# Patient Record
Sex: Female | Born: 1990 | Race: White | Hispanic: No | Marital: Married | Smoking: Never smoker
Health system: Southern US, Community
[De-identification: ages and names within clinical notes are randomized; demographics above are authoritative.]

## PROBLEM LIST (undated history)

## (undated) DIAGNOSIS — K219 Gastro-esophageal reflux disease without esophagitis: Secondary | ICD-10-CM

## (undated) DIAGNOSIS — N200 Calculus of kidney: Secondary | ICD-10-CM

## (undated) DIAGNOSIS — F419 Anxiety disorder, unspecified: Secondary | ICD-10-CM

## (undated) DIAGNOSIS — K8020 Calculus of gallbladder without cholecystitis without obstruction: Secondary | ICD-10-CM

## (undated) DIAGNOSIS — R519 Headache, unspecified: Secondary | ICD-10-CM

## (undated) DIAGNOSIS — S060XAA Concussion with loss of consciousness status unknown, initial encounter: Secondary | ICD-10-CM

## (undated) HISTORY — DX: Gastro-esophageal reflux disease without esophagitis: K21.9

---

## 2013-07-27 HISTORY — PX: DILATION AND CURETTAGE OF UTERUS: SHX78

## 2015-10-29 DIAGNOSIS — Z6823 Body mass index (BMI) 23.0-23.9, adult: Secondary | ICD-10-CM | POA: Diagnosis not present

## 2015-10-29 DIAGNOSIS — N939 Abnormal uterine and vaginal bleeding, unspecified: Secondary | ICD-10-CM | POA: Diagnosis not present

## 2015-10-29 DIAGNOSIS — R102 Pelvic and perineal pain: Secondary | ICD-10-CM | POA: Diagnosis not present

## 2015-11-14 DIAGNOSIS — R102 Pelvic and perineal pain: Secondary | ICD-10-CM | POA: Diagnosis not present

## 2015-11-15 DIAGNOSIS — H60399 Other infective otitis externa, unspecified ear: Secondary | ICD-10-CM | POA: Diagnosis not present

## 2016-01-06 DIAGNOSIS — H938X3 Other specified disorders of ear, bilateral: Secondary | ICD-10-CM | POA: Diagnosis not present

## 2016-01-06 DIAGNOSIS — H6983 Other specified disorders of Eustachian tube, bilateral: Secondary | ICD-10-CM | POA: Diagnosis not present

## 2016-01-08 DIAGNOSIS — W57XXXA Bitten or stung by nonvenomous insect and other nonvenomous arthropods, initial encounter: Secondary | ICD-10-CM | POA: Diagnosis not present

## 2016-01-08 DIAGNOSIS — S0006XA Insect bite (nonvenomous) of scalp, initial encounter: Secondary | ICD-10-CM | POA: Diagnosis not present

## 2016-01-14 ENCOUNTER — Ambulatory Visit (INDEPENDENT_AMBULATORY_CARE_PROVIDER_SITE_OTHER): Payer: BLUE CROSS/BLUE SHIELD | Admitting: Osteopathic Medicine

## 2016-01-14 ENCOUNTER — Encounter: Payer: Self-pay | Admitting: Osteopathic Medicine

## 2016-01-14 VITALS — BP 118/74 | HR 84 | Ht 65.0 in | Wt 136.0 lb

## 2016-01-14 DIAGNOSIS — Z0000 Encounter for general adult medical examination without abnormal findings: Secondary | ICD-10-CM | POA: Diagnosis not present

## 2016-01-14 DIAGNOSIS — Z862 Personal history of diseases of the blood and blood-forming organs and certain disorders involving the immune mechanism: Secondary | ICD-10-CM

## 2016-01-14 DIAGNOSIS — IMO0001 Reserved for inherently not codable concepts without codable children: Secondary | ICD-10-CM | POA: Insufficient documentation

## 2016-01-14 DIAGNOSIS — R5382 Chronic fatigue, unspecified: Secondary | ICD-10-CM

## 2016-01-14 DIAGNOSIS — Z309 Encounter for contraceptive management, unspecified: Secondary | ICD-10-CM

## 2016-01-14 DIAGNOSIS — Z8249 Family history of ischemic heart disease and other diseases of the circulatory system: Secondary | ICD-10-CM | POA: Diagnosis not present

## 2016-01-14 NOTE — Patient Instructions (Signed)
Can come back for fasting labs If labs are looking ok, can come back in one year for annual physical / wellness exam, but sooner if needed!

## 2016-01-14 NOTE — Progress Notes (Signed)
HPI: Cindy Dunlap is a 25 y.o. female who presents to [name] today for chief complaint of:  Chief Complaint  Patient presents with  . Establish Care    DISCUSS IRON AND CHOLESTEROL    IRON - concerned about low iron, has been longstanding issues. (+) anemia in pregnancy.   CHOLESTEROL - concerned about cholesterol, father deceased of MI in 56s.   PREVENTIVE CARE REVIEWED AS BELOW  Past medical, social and family history reviewed: History reviewed. No pertinent past medical history. History reviewed. No pertinent past surgical history. Social History  Substance Use Topics  . Smoking status: Not on file  . Smokeless tobacco: Not on file  . Alcohol Use: Not on file   Family History  Problem Relation Age of Onset  . Hyperlipidemia Father   . Hypertension Father   . Diabetes Paternal Grandmother   . Stroke Paternal Grandmother   . Cancer Paternal Grandfather     LIVER  . Heart attack Father   . Heart attack Paternal Grandfather   . Heart attack Paternal Grandmother     No current outpatient prescriptions on file.   No current facility-administered medications for this visit.   Allergies  Allergen Reactions  . Ciprofloxacin Other (See Comments)    Headache      Review of Systems: CONSTITUTIONAL:  No  fever, no chills, No recent illness, No unintentional weight changes HEAD/EYES/EARS/NOSE/THROAT: No  headache, no vision change, no hearing change, No sore throat, No  sinus pressure CARDIAC: No  chest pain, No  pressure, No palpitations, No  orthopnea RESPIRATORY: No  cough, No  shortness of breath/wheeze GASTROINTESTINAL: No  nausea, No  vomiting, No  abdominal pain, No  blood in stool, No  diarrhea, No  constipation  MUSCULOSKELETAL: No  myalgia/arthralgia GENITOURINARY: No  incontinence, No  abnormal genital bleeding/discharge SKIN: No  rash/wounds/concerning lesions HEM/ONC: No  easy bruising/bleeding, No  abnormal lymph  node ENDOCRINE: No polyuria/polydipsia/polyphagia, No  heat/cold intolerance  NEUROLOGIC: No  weakness, No  dizziness, No  slurred speech PSYCHIATRIC: No  concerns with depression, No  concerns with anxiety, No sleep problems  Exam:  BP 118/74 mmHg  Pulse 84  Ht  (1.651 m)  Wt 136 lb (61.689 kg)  BMI 22.63 kg/m2 Constitutional: VS see above. General Appearance: alert, well-developed, well-nourished, NAD Eyes: Normal lids and conjunctive, non-icteric sclera, PERRLA Ears, Nose, Mouth, Throat: MMM, Normal external inspection ears/nares/mouth/lips/gums, TM normal bilaterally. Pharynx/tonsils no erythema, no exudate. Nasal mucosa normal.  Neck: No masses, trachea midline. No thyroid enlargement. No tenderness/mass appreciated. No lymphadenopathy Respiratory: Normal respiratory effort. no wheeze, no rhonchi, no rales Cardiovascular: S1/S2 normal, no murmur, no rub/gallop auscultated. RRR. No lower extremity edema. Gastrointestinal: Nontender, no masses. No hepatomegaly, no splenomegaly. No hernia appreciated. Bowel sounds normal. Rectal exam deferred.  Musculoskeletal: Gait normal. No clubbing/cyanosis of digits.  Neurological: No cranial nerve deficit on limited exam. Motor and sensation intact and symmetric Skin: warm, dry, intact. No rash/ulcer. No concerning nevi or subq nodules on limited exam.   Psychiatric: Normal judgment/insight. Normal mood and affect. Oriented x3.     ASSESSMENT/PLAN:  Annual physical exam - lipid screen given (+)FH, decline Td booster, recent Pap normal per patient  Family history of cardiac disorder - Plan: Lipid panel  Encounter for contraceptive management, unspecified encounter - Husband has had vasectomy   History of anemia - Plan: CBC with Differential/Platelet, Ferritin  Chronic fatigue - Plan: CBC with Differential/Platelet, TSH, VITAMIN D  25 Hydroxy (Vit-D Deficiency, Fractures), COMPLETE METABOLIC PANEL WITH GFR    FEMALE PREVENTIVE  CARE  ANNUAL SCREENING/COUNSELING Tobacco - noNever  Alcohol - social drinker Diet/Exercise - HEALTHY HABITS DISCUSSED TO DECREASE CV RISK Depression - PQH2 Negative Domestic violence concerns - no HTN SCREENING - SEE VITALS Vaccination status - SEE BELOW  SEXUAL HEALTH Sexually active in the past year - yes With - Yes with female. STI - The patient denies history of sexually transmitted disease. STI testing today? - no   INFECTIOUS DISEASE SCREENING HIV - all adults 15-65 - does not need GC/CT - sexually active - does not need HepC - DOB [birth year] - does not need TB - does not need  DISEASE SCREENING Lipid - needs DM2 - needs Osteoporosis - does not need  CANCER SCREENING Cervical - does not need Breast - does not need Lung - does not need Colon - does not need  ADULT VACCINATION Influenza - was offered and declined by the patient Td - was offered and declined by the patient - she did not have this with her most recent pregnancy HPV - was not indicated Zoster - was not indicated Pneumonia - was not indicated  OTHER Fall - exercise and Vit D age 14+ - does not need Consider ASA - age 25-59 - does not need   All questions were answered. Visit summary with medication list and pertinent instructions was printed for patient to review. ER/RTC precautions were reviewed with the patient. Return in about 1 year (around 01/13/2017), or sooner if needed or if any new concerns, for [employer] .

## 2016-01-19 DIAGNOSIS — L0201 Cutaneous abscess of face: Secondary | ICD-10-CM | POA: Diagnosis not present

## 2016-01-22 ENCOUNTER — Encounter: Payer: Self-pay | Admitting: Osteopathic Medicine

## 2016-01-22 ENCOUNTER — Ambulatory Visit (INDEPENDENT_AMBULATORY_CARE_PROVIDER_SITE_OTHER): Payer: BLUE CROSS/BLUE SHIELD | Admitting: Osteopathic Medicine

## 2016-01-22 VITALS — BP 118/81 | HR 81 | Ht 65.0 in | Wt 138.0 lb

## 2016-01-22 DIAGNOSIS — L739 Follicular disorder, unspecified: Secondary | ICD-10-CM | POA: Diagnosis not present

## 2016-01-22 MED ORDER — CLINDAMYCIN HCL 150 MG PO CAPS: 300.0000 mg | ORAL_CAPSULE | Freq: Three times a day (TID) | ORAL | Status: DC

## 2016-01-22 NOTE — Progress Notes (Signed)
HPI: Cindy Dunlap is a 25 y.o. female who [facility] for chief complaint of:  Chief Complaint  Patient presents with  . Insect Bite    on right ear     . Context: went to ER, they manually popped the lesion and put her on antibiotics  -Clinda 300 mg qid x 10 days . Location: R ear under earlobe . Quality: was red skin after ER, brother told her might be MRSA so she is worried. No longer draining, some soreness on that side near lesion . Severity: better - pt brings photo of initial lesion - c/w boil/folliculitis . Modifying factors: Clinda as above . Assoc signs/symptoms: see ROS   Past medical, social and family history reviewed: No past medical history on file. No past surgical history on file. Social History  Substance Use Topics  . Smoking status: Never Smoker   . Smokeless tobacco: Not on file  . Alcohol Use: Not on file   Family History  Problem Relation Age of Onset  . Hyperlipidemia Father   . Hypertension Father   . Diabetes Paternal Grandmother   . Stroke Paternal Grandmother   . Cancer Paternal Grandfather     LIVER  . Heart attack Father   . Heart attack Paternal Grandfather   . Heart attack Paternal Grandmother     No current outpatient prescriptions on file.   No current facility-administered medications for this visit.   Allergies  Allergen Reactions  . Ciprofloxacin Other (See Comments)    Headache      Review of Systems: CONSTITUTIONAL:  No  fever, no chills, No recent illness HEAD/EYES/EARS/NOSE/THROAT: No  headache, no vision change MUSCULOSKELETAL: No  myalgia/arthralgia GENITOURINARY: No  incontinence, No  abnormal genital bleeding/discharge SKIN: (+) rash/wounds/concerning lesions as per HPI NEUROLOGIC: No  weakness, No  dizziness  Exam:  BP 118/81 mmHg  Pulse 81  Ht 5\' 5"  (1.651 m)  Wt 138 lb (62.596 kg)  BMI 22.96 kg/m2 Constitutional: VS see above. General Appearance: alert,  well-developed, well-nourished, NAD Neck: No masses, trachea midline. No thyroid enlargement. No tenderness/mass appreciated. No lymphadenopathy Skin: Under L ear small scab, no erythema/drainage, no lymphadenopathy, SKin is otherwise warm, dry, intact. No rash/ulcer. No concerning nevi or subq nodules on limited exam.   Psychiatric: Normal judgment/insight. Normal mood and affect. Oriented x3.    No results found for this or any previous visit (from the past 72 hour(s)).  No results found.   ASSESSMENT/PLAN: Reassured, decreased dose Clinda, pt is worred about MRSA exposure, advised Clinda should cover for this.   Folliculitis - Clinda from ER at high dose and for long duration, can decrease dose/duration as below. Healing normally, RTC precautions reviewed    All questions were answered. Visit summary with medication list and pertinent instructions was printed for patient to review. ER/RTC precautions were reviewed with the patient. Return if symptoms worsen or fail to improve.

## 2016-03-02 DIAGNOSIS — D225 Melanocytic nevi of trunk: Secondary | ICD-10-CM | POA: Diagnosis not present

## 2016-03-02 DIAGNOSIS — D2261 Melanocytic nevi of right upper limb, including shoulder: Secondary | ICD-10-CM | POA: Diagnosis not present

## 2016-03-09 DIAGNOSIS — D280 Benign neoplasm of vulva: Secondary | ICD-10-CM | POA: Diagnosis not present

## 2016-03-31 DIAGNOSIS — L700 Acne vulgaris: Secondary | ICD-10-CM | POA: Diagnosis not present

## 2016-04-09 ENCOUNTER — Ambulatory Visit: Payer: BLUE CROSS/BLUE SHIELD | Admitting: Osteopathic Medicine

## 2016-04-13 ENCOUNTER — Ambulatory Visit (INDEPENDENT_AMBULATORY_CARE_PROVIDER_SITE_OTHER): Payer: BLUE CROSS/BLUE SHIELD | Admitting: Osteopathic Medicine

## 2016-04-13 ENCOUNTER — Encounter: Payer: Self-pay | Admitting: Osteopathic Medicine

## 2016-04-13 VITALS — BP 128/80 | HR 99 | Ht 65.0 in | Wt 137.0 lb

## 2016-04-13 DIAGNOSIS — Z711 Person with feared health complaint in whom no diagnosis is made: Secondary | ICD-10-CM

## 2016-04-13 NOTE — Progress Notes (Signed)
HPI: Cindy Dunlap is a 25 y.o. female  who presents to [name] today, 04/13/16,  for chief complaint of:  Chief Complaint  Patient presents with  . Anxiety    Had episode last week on her way to 34-year-old photo shoot for her daughter here she was very irritable, panicking that she had forgotten her daughter at home, had to pull the car over and make sure child was there, was yelling at her other kids. She thinks may be due to hormones, she is weaning from beast feeding, first period in some time.   Anxiety evaluation:  Excessive anxiety/worry 6 mos+: No  Home and work/school: No  Difficulty to control: No  Associated symptoms (3/adult, 1/child):     Restlessness/on edge No      Fatigue Yes      Concentration Yes      Irritable Yes      Muscle tension No      Sleep disturbance No  Imparled social/occupational functioning: No   Major Depression Evaluation: One of the following for 2wk, most of the day on most days?  Depression: No    Anhedonia/reduced interest: No  Four of the following, most of the day on most days?  Weight change unintentional: No   Sleep pattern change: No   Psychomotor symptom: No   Fatigue: Yes   Worthlessness/guilt: Yes   Concentration problems: Yes   Thoughts of death/suicide: No  Concern for other causes?  Drug-induced problems: No   Bipolar disorder: No - neg questionaire  Delusional: No   Hallucinations: No   Bereavement/grief: No   Hypothyroid: No   Alcohol: No      Past medical, surgical, social and family history reviewed: No past medical history on file. No past surgical history on file. Social History  Substance Use Topics  . Smoking status: Never Smoker  . Smokeless tobacco: Not on file  . Alcohol use Not on file   Family History  Problem Relation Age of Onset  . Hyperlipidemia Father   . Hypertension Father   . Diabetes Paternal Grandmother   . Stroke Paternal Grandmother   . Cancer  Paternal Grandfather     LIVER  . Heart attack Father   . Heart attack Paternal Grandfather   . Heart attack Paternal Grandmother      Current medication list and allergy/intolerance information reviewed:   Current Outpatient Prescriptions  Medication Sig Dispense Refill  . clindamycin (CLEOCIN) 150 MG capsule Take 2 capsules (300 mg total) by mouth 3 (three) times daily. FOR 5 DAYS (Patient not taking: Reported on 04/13/2016) 21 capsule 0   No current facility-administered medications for this visit.    Allergies  Allergen Reactions  . Ciprofloxacin Other (See Comments)    Headache      Review of Systems:  Constitutional:  No  fever, no chills, No recent illness, No unintentional weight changes. +significant fatigue.   HEENT: No  headache, no vision change  Cardiac: No  chest pain  Respiratory:  No  shortness of breath. No  Cough  Gastrointestinal: No  abdominal pain, No  nausea,   Endocrine: No cold intolerance,  No heat intolerance. No polyuria/polydipsia/polyphagia   Neurologic: No  weakness, No  dizziness  Psychiatric: No  concerns with depression, +concerns with anxiety, No sleep problems, No mood problems  Exam:  BP 128/80   Pulse 99   Ht 5\' 5"  (1.651 m)   Wt 137 lb (62.1 kg)  BMI 22.80 kg/m   Constitutional: VS see above. General Appearance: alert, well-developed, well-nourished, NAD  Respiratory: Normal respiratory effort.  Musculoskeletal: Gait normal.   Neurological: Normal balance/coordination. No tremor.   Skin: warm, dry, intact.   Psychiatric: Normal judgment/insight. Normal mood and affect. Oriented x3.   GAD 7 : Generalized Anxiety Score 04/13/2016  Nervous, Anxious, on Edge 1  Control/stop worrying 1  Worry too much - different things 1  Trouble relaxing 1  Restless 1  Easily annoyed or irritable 1  Afraid - awful might happen 1  Total GAD 7 Score 7  Anxiety Difficulty Somewhat difficult    Depression screen [facility] 04/13/2016   Decreased Interest 0  Down, Depressed, Hopeless 0  PHQ - 2 Score 0  Altered sleeping 0  Tired, decreased energy 1  Change in appetite 0  Feeling bad or failure about yourself  1  Trouble concentrating 1  Moving slowly or fidgety/restless 1  Suicidal thoughts 1  PHQ-9 Score 5     ASSESSMENT/PLAN:   Not meeting criteria for GAD, sounds liek an isolated incident, hormonal changes w/ weaning from breast feeding and first period in some time may be a factor in irritability and mood, episode has resolved without recurrence. Monitor closely, RTC if any concerns for new or worsening symptoms or recurrence  Worried well - mentally well but worried   Visit summary with medication list and pertinent instructions was printed for patient to review. All questions at time of visit were answered - patient instructed to contact office with any additional concerns. ER/RTC precautions were reviewed with the patient. Follow-up plan: No Follow-up on file.

## 2016-08-28 ENCOUNTER — Ambulatory Visit (INDEPENDENT_AMBULATORY_CARE_PROVIDER_SITE_OTHER): Payer: Managed Care, Other (non HMO) | Admitting: Sports Medicine

## 2016-08-28 VITALS — BP 112/81 | HR 137 | Temp 99.0°F | Resp 18 | Wt 147.0 lb

## 2016-08-28 DIAGNOSIS — R110 Nausea: Secondary | ICD-10-CM

## 2016-08-28 DIAGNOSIS — H9193 Unspecified hearing loss, bilateral: Secondary | ICD-10-CM | POA: Diagnosis not present

## 2016-08-28 DIAGNOSIS — H6122 Impacted cerumen, left ear: Secondary | ICD-10-CM | POA: Insufficient documentation

## 2016-08-28 DIAGNOSIS — R69 Illness, unspecified: Secondary | ICD-10-CM

## 2016-08-28 DIAGNOSIS — J111 Influenza due to unidentified influenza virus with other respiratory manifestations: Secondary | ICD-10-CM | POA: Insufficient documentation

## 2016-08-28 MED ORDER — ONDANSETRON 4 MG PO TBDP
8.0000 mg | ORAL_TABLET | Freq: Once | ORAL | Status: AC
  Administered 2016-08-28: 8 mg via ORAL

## 2016-08-28 MED ORDER — BENZONATATE 200 MG PO CAPS: 200.0000 mg | ORAL_CAPSULE | Freq: Three times a day (TID) | ORAL | 0 refills | Status: DC | PRN

## 2016-08-28 MED ORDER — OSELTAMIVIR PHOSPHATE 75 MG PO CAPS: 75.0000 mg | ORAL_CAPSULE | Freq: Two times a day (BID) | ORAL | 0 refills | Status: DC

## 2016-08-28 MED ORDER — ONDANSETRON 8 MG PO TBDP: 8.0000 mg | ORAL_TABLET | Freq: Three times a day (TID) | ORAL | 3 refills | Status: DC | PRN

## 2016-08-28 NOTE — Assessment & Plan Note (Addendum)
Tamiflu, Tessalon Perles, Zofran.

## 2016-08-28 NOTE — Progress Notes (Signed)
  Subjective:    CC: Feeling sick  HPI: For the past day this pleasant 26 year old female has had severe muscle aches, body aches, fatigue, sore throat, cough, runny nose.  She also complains of difficulty hearing for years. Her grandfather had very early hearing loss, she was seen by ENT, nothing was found, she has never been evaluated formally by audiology.  Past medical history:  Negative.  See flowsheet/record as well for more information.  Surgical history: Negative.  See flowsheet/record as well for more information.  Family history: Negative.  See flowsheet/record as well for more information.  Social history: Negative.  See flowsheet/record as well for more information.  Allergies, and medications have been entered into the medical record, reviewed, and no changes needed.   Review of Systems: No fevers, chills, night sweats, weight loss, chest pain, or shortness of breath.   Objective:    General: Well Developed, well nourished, and in no acute distress.  Neuro: Alert and oriented x3, extra-ocular muscles intact, sensation grossly intact.  HEENT: Normocephalic, atraumatic, pupils equal round reactive to light, neck supple, no masses, no lymphadenopathy, thyroid nonpalpable. Oropharynx, nasopharynx unremarkable, left ear canal is occluded with cerumen Skin: Warm and dry, no rashes. Cardiac: Regular rate and rhythm, no murmurs rubs or gallops, no lower extremity edema.  Respiratory: Clear to auscultation bilaterally. Not using accessory muscles, speaking in full sentences.  Indication: Cerumen impaction of the ear(s) Medical necessity statement: On physical examination, cerumen impairs clinically significant portions of the external auditory canal, and tympanic membrane. Noted obstructive, copious cerumen that cannot be removed without magnification and instrumentations requiring physician skills Consent: Discussed benefits and risks of procedure and verbal consent  obtained Procedure: Patient was prepped for the procedure. Utilized an otoscope to assess and take note of the ear canal, the tympanic membrane, and the presence, amount, and placement of the cerumen. Gentle water irrigation and soft plastic curette was utilized to remove cerumen.  Post procedure examination: shows cerumen was completely removed. Patient tolerated procedure well. The patient is made aware that they may experience temporary vertigo, temporary hearing loss, and temporary discomfort. If these symptom last for more than 24 hours to call the clinic or proceed to the ED.  Impression and Recommendations:    Influenza-like illness Tamiflu, Tessalon Perles, Zofran.  Hearing loss Mostly right-sided, I did clear cerumen from her left side and she can hear significantly better, right side Canal is clear. I do think she needs a referral to audiology for formal testing.  I spent 25 minutes with this patient, greater than 50% was face-to-face time counseling regarding the above diagnoses

## 2016-08-28 NOTE — Assessment & Plan Note (Signed)
Mostly right-sided, I did clear cerumen from her left side and she can hear significantly better, right side Canal is clear. I do think she needs a referral to audiology for formal testing.

## 2016-12-03 DIAGNOSIS — Z3202 Encounter for pregnancy test, result negative: Secondary | ICD-10-CM | POA: Diagnosis not present

## 2016-12-03 DIAGNOSIS — R102 Pelvic and perineal pain: Secondary | ICD-10-CM | POA: Diagnosis not present

## 2016-12-14 DIAGNOSIS — R102 Pelvic and perineal pain: Secondary | ICD-10-CM | POA: Diagnosis not present

## 2016-12-15 ENCOUNTER — Ambulatory Visit (INDEPENDENT_AMBULATORY_CARE_PROVIDER_SITE_OTHER): Payer: Managed Care, Other (non HMO) | Admitting: Osteopathic Medicine

## 2016-12-15 ENCOUNTER — Encounter: Payer: Self-pay | Admitting: Osteopathic Medicine

## 2016-12-15 ENCOUNTER — Other Ambulatory Visit (HOSPITAL_COMMUNITY)
Admission: RE | Admit: 2016-12-15 | Discharge: 2016-12-15 | Disposition: A | Discharge status: Home / Self Care | Payer: Managed Care, Other (non HMO) | Source: Ambulatory Visit | Attending: Osteopathic Medicine | Admitting: Osteopathic Medicine

## 2016-12-15 VITALS — BP 107/72 | HR 69 | Ht 65.0 in | Wt 141.0 lb

## 2016-12-15 DIAGNOSIS — Z0000 Encounter for general adult medical examination without abnormal findings: Secondary | ICD-10-CM | POA: Insufficient documentation

## 2016-12-15 DIAGNOSIS — L299 Pruritus, unspecified: Secondary | ICD-10-CM | POA: Diagnosis not present

## 2016-12-15 DIAGNOSIS — Z862 Personal history of diseases of the blood and blood-forming organs and certain disorders involving the immune mechanism: Secondary | ICD-10-CM

## 2016-12-15 DIAGNOSIS — R5383 Other fatigue: Secondary | ICD-10-CM | POA: Diagnosis not present

## 2016-12-15 MED ORDER — KETOCONAZOLE 2 % EX SHAM: 1.0000 "application " | MEDICATED_SHAMPOO | CUTANEOUS | 0 refills | Status: DC

## 2016-12-15 NOTE — Progress Notes (Signed)
HPI: Cindy Dunlap is a 26 y.o. female  who [facility] today, 12/15/16,  for chief complaint of:  Chief Complaint  Patient presents with  . Annual Exam    Patient here for annual physical / wellness exam.  See preventive care reviewed as below.  Recent labs reviewed in detail with the patient.   Additional concerns today include:   Scalp itching: Itching at basis all, no visible rash, no drainage/bleeding. No household contacts with lice or other known infection. Has been going on for about 2 weeks, started immediately after shampoo change, she stopped using that she'll be thinking it might be irritating things that the itching has persisted.  Fatigue: Did not get labs done at last visit, concerned about thyroid and possible iron deficiency  Skin concern on leg: Previously following with dermatology, unknown steroid prescription was prescribed for her by them,  Past medical, surgical, social and family history reviewed: Patient Active Problem List   Diagnosis Date Noted  . Influenza-like illness 08/28/2016  . Hearing loss 08/28/2016  . Birth control 01/14/2016  . History of anemia 01/14/2016  . Family history of cardiac disorder 01/14/2016  . Annual physical exam 01/14/2016   No past surgical history on file.   Social History  Substance Use Topics  . Smoking status: Never Smoker  . Smokeless tobacco: Never Used  . Alcohol use Not on file   Family History  Problem Relation Age of Onset  . Hyperlipidemia Father   . Hypertension Father   . Diabetes Paternal Grandmother   . Stroke Paternal Grandmother   . Cancer Paternal Grandfather        LIVER  . Heart attack Father   . Heart attack Paternal Grandfather   . Heart attack Paternal Grandmother      Current medication list and allergy/intolerance information reviewed:   No current outpatient prescriptions on file.   No current facility-administered medications for this  visit.    Allergies  Allergen Reactions  . Ciprofloxacin Other (See Comments)    Headache      Review of Systems:  Constitutional:  No  fever, no chills, No recent illness  HEENT: No  headache, no vision change  Cardiac: No  chest pain, No  pressure  Respiratory:  No  shortness of breath. No  Cough  Gastrointestinal: No  abdominal pain, No  nausea  Musculoskeletal: No new myalgia/arthralgia  Genitourinary: No  incontinence, No  abnormal genital bleeding, No abnormal genital discharge  Skin: as per HPI  Endocrine: No cold intolerance,  No heat intolerance.  Neurologic: No  weakness, No  dizziness, No  slurred speech/focal weakness/facial droop  Psychiatric: No  concerns with depression, No  concerns with anxiety, No sleep problems, No mood problems  Exam:  BP 107/72   Pulse 69   Ht 5\' 5"  (1.651 m)   Wt 141 lb (64 kg)   BMI 23.46 kg/m   Constitutional: VS see above. General Appearance: alert, well-developed, well-nourished, NAD  Eyes: Normal lids and conjunctive, non-icteric sclera  Ears, Nose, Mouth, Throat: MMM, Normal external inspection ears/nares/mouth/lips/gums.   Neck: No masses, trachea midline. No thyroid enlargement. No tenderness/mass appreciated. No lymphadenopathy  Respiratory: Normal respiratory effort. no wheeze, no rhonchi, no rales  Cardiovascular: S1/S2 normal, no murmur, no rub/gallop auscultated. RRR. No lower extremity edema.  Gastrointestinal: Nontender, no masses. No hepatomegaly, no splenomegaly. No hernia appreciated. Bowel sounds normal. Rectal exam deferred.   Musculoskeletal: Gait normal. No clubbing/cyanosis of  digits.   Neurological: Normal balance/coordination. No tremor. No cranial nerve deficit on limited exam. Motor and sensation intact and symmetric. Cerebellar reflexes intact.   Skin: warm, dry, intact. No rash/ulcer. No concerning nevi or subq nodules on limited exam.  Scalp: No discolorations/ulceration. It looks like  dandruff/possible mild seborrheic dermatitis. Discolored patch on left leg, patient states was previously following with dermatology, no drainage/ulceration, no erythema  Psychiatric: Normal judgment/insight. Normal mood and affect. Oriented x3.  GYN: No lesions/ulcers to external genitalia, normal urethra, normal vaginal mucosa, physiologic discharge, cervix normal without lesions, uterus not enlarged or tender, adnexa no masses and nontender  BREAST: No rashes/skin changes, normal fibrous breast tissue, no masses or tenderness, normal nipple without discharge, normal axilla    ASSESSMENT/PLAN:   Annual physical exam - Plan: Cytology - PAP, CBC, COMPLETE METABOLIC PANEL WITH GFR, Lipid panel, TSH, T4, free, VITAMIN D 25 Hydroxy (Vit-D Deficiency, Fractures), Iron, TIBC and Ferritin Panel  History of anemia - Plan: CBC, Iron, TIBC and Ferritin Panel  Fatigue, unspecified type - Plan: CBC, COMPLETE METABOLIC PANEL WITH GFR, TSH, T4, free, VITAMIN D 25 Hydroxy (Vit-D Deficiency, Fractures), Iron, TIBC and Ferritin Panel  Scalp itch - Post consistent with dandruff/seborrheic dermatitis. Trial ketoconazole shampoo, patient is established with a dermatologist, follow-up with them if needed - Plan: ketoconazole (NIZORAL) 2 % shampoo     FEMALE PREVENTIVE CARE  ANNUAL SCREENING/COUNSELING Tobacco - no never  Alcohol - social drinker Diet/Exercise - HEALTHY HABITS DISCUSSED TO DECREASE CV RISK Depression - PQH2 Negative  Domestic violence concerns - no HTN SCREENING - SEE VITALS Vaccination status - SEE BELOW  SEXUAL HEALTH Sexually active in the past year - yes With - Yes with female - monogamous w/ husband STI - The patient denies history of sexually transmitted disease. STI testing today? - declined Contraception: husband has vasectomy   INFECTIOUS DISEASE SCREENING HIV - all adults 15-65 - needs - declined GC/CT - sexually active - needs - declined HepC - DOB [birth year] - does  not need TB - does not need  DISEASE SCREENING Lipid - needs DM2 - needs Osteoporosis - does not need  CANCER SCREENING Cervical - needs Breast - does not need Lung - does not need Colon - does not need  ADULT VACCINATION Influenza - was offered and declined by the patient last fall Td - was offered and declined by the patient - she did not have this with her most recent pregnancy HPV - was not indicated Zoster - was not indicated Pneumonia - was not indicated     Patient Instructions  Plan: 1. Labs for further evaluation of fatigue and for routine screening 2. Shampoo for scalp 3. Call dermatologist for refill on steroids  4. You should hear back about Pap and lab results within one week, please let us know if you don't receive your results.  5. Plan follow-up when due for next annual physical, or sooner if any problems arise!   Take care!      Visit summary with medication list and pertinent instructions was printed for patient to review. All questions at time of visit were answered - patient instructed to contact office with any additional concerns. ER/RTC precautions were reviewed with the patient. Follow-up plan: Return in about 1 year (around 12/15/2017) for [facility] PHYSICAL, sooner if needed .

## 2016-12-15 NOTE — Patient Instructions (Signed)
Plan: 1. Labs for further evaluation of fatigue and for routine screening 2. Shampoo for scalp 3. Call dermatologist for refill on steroids  4. You should hear back about Pap and lab results within one week, please let us know if you don't receive your results.  5. Plan follow-up when due for next annual physical, or sooner if any problems arise!   Take care!      Please note: Preventive care issues were addressed today per annual physical requirements and should be covered under your insurance, however there were other medical issues which were also addressed and insurance may bill you separately for "problem-based visit" for evaluation and treatment of fatigue and scalp dermatitis. Any questions or concerns about charges which may appear on your billing statement should be directed to your insurance company or to [facility] billing department, please contact our office with any other questions.

## 2016-12-22 LAB — CYTOLOGY - PAP
Diagnosis: NEGATIVE
HPV: NOT DETECTED

## 2017-04-30 ENCOUNTER — Ambulatory Visit (INDEPENDENT_AMBULATORY_CARE_PROVIDER_SITE_OTHER): Payer: BLUE CROSS/BLUE SHIELD | Admitting: Physician Assistant

## 2017-04-30 ENCOUNTER — Encounter: Payer: Self-pay | Admitting: Physician Assistant

## 2017-04-30 VITALS — BP 110/76 | HR 74 | Temp 98.0°F | Ht 65.0 in | Wt 144.0 lb

## 2017-04-30 DIAGNOSIS — H6122 Impacted cerumen, left ear: Secondary | ICD-10-CM | POA: Diagnosis not present

## 2017-04-30 NOTE — Progress Notes (Signed)
HPI:                                                                Cindy Dunlap is a 26 y.o. female who presents to [name] today for left ear pain  Otalgia   There is pain in the left ear. This is a new problem. The current episode started today. The problem occurs constantly. The problem has been unchanged. The pain is mild. Associated symptoms include hearing loss. Pertinent negatives include no ear discharge, rhinorrhea or sore throat.     No past medical history on file. No past surgical history on file. Social History  Substance Use Topics  . Smoking status: Never Smoker  . Smokeless tobacco: Never Used  . Alcohol use Not on file   family history includes Cancer in her paternal grandfather; Diabetes in her paternal grandmother; Heart attack in her father, paternal grandfather, and paternal grandmother; Hyperlipidemia in her father; Hypertension in her father; Stroke in her paternal grandmother.  ROS: negative except as noted in the HPI  Medications: Current Outpatient Prescriptions  Medication Sig Dispense Refill  . ketoconazole (NIZORAL) 2 % shampoo Apply 1 application topically 2 (two) times a week. 120 mL 0   No current facility-administered medications for this visit.    Allergies  Allergen Reactions  . Ciprofloxacin Other (See Comments)    Headache       Objective:  BP 110/76   Pulse 74   Temp 98 F (36.7 C)   Ht  (1.651 m)   Wt 144 lb (65.3 kg)   BMI 23.96 kg/m  Gen:  alert, not ill-appearing, no distress, appropriate for age HEENT: head normocephalic without obvious abnormality, conjunctiva and cornea clear, right ear canal partially occluded by cerumen, left canal completed occluded with cerumen, no mastoid tenderness or redness, trachea midline Pulm: Normal work of breathing, normal phonation Neuro: alert and oriented x 3, no tremor MSK: extremities atraumatic, normal gait and  station Skin: intact, no rashes on exposed skin, no jaundice, no cyanosis   No results found for this or any previous visit (from the past 72 hour(s)). No results found.    Assessment and Plan: 26 y.o. female with   1. Hearing loss due to cerumen impaction, left Ceruminosis is noted.  Wax is removed by syringing and manual debridement. Otoscopic exam shows intact TM's bilaterally. Instructions for home care to prevent wax buildup are given.  Patient education and anticipatory guidance given Patient agrees with treatment plan Follow-up as needed if symptoms worsen or fail to improve  Levonne Hubert PA-C

## 2017-04-30 NOTE — Patient Instructions (Signed)
Place Debrox drops or mineral oil in the affected ear as needed for hearing loss/wax build-up. Do not put any Q-tips in the ear. Return for ear irrigation as needed  Earwax Buildup, Adult The ears produce a substance called earwax that helps keep bacteria out of the ear and protects the skin in the ear canal. Occasionally, earwax can build up in the ear and cause discomfort or hearing loss. What increases the risk? This condition is more likely to develop in people who:  Are female.  Are elderly.  Naturally produce more earwax.  Clean their ears often with cotton swabs.  Use earplugs often.  Use in-ear headphones often.  Wear hearing aids.  Have narrow ear canals.  Have earwax that is overly thick or sticky.  Have eczema.  Are dehydrated.  Have excess hair in the ear canal.  What are the signs or symptoms? Symptoms of this condition include:  Reduced or muffled hearing.  A feeling of fullness in the ear or feeling that the ear is plugged.  Fluid coming from the ear.  Ear pain.  Ear itch.  Ringing in the ear.  Coughing.  An obvious piece of earwax that can be seen inside the ear canal.  How is this diagnosed? This condition may be diagnosed based on:  Your symptoms.  Your medical history.  An ear exam. During the exam, your health care provider will look into your ear with an instrument called an otoscope.  You may have tests, including a hearing test. How is this treated? This condition may be treated by:  Using ear drops to soften the earwax.  Having the earwax removed by a health care provider. The health care provider may: ? Flush the ear with water. ? Use an instrument that has a loop on the end (curette). ? Use a suction device.  Surgery to remove the wax buildup. This may be done in severe cases.  Follow these instructions at home:  Take over-the-counter and prescription medicines only as told by your health care provider.  Do not put  any objects, including cotton swabs, into your ear. You can clean the opening of your ear canal with a washcloth or facial tissue.  Follow instructions from your health care provider about cleaning your ears. Do not over-clean your ears.  Drink enough fluid to keep your urine clear or pale yellow. This will help to thin the earwax.  Keep all follow-up visits as told by your health care provider. If earwax builds up in your ears often or if you use hearing aids, consider seeing your health care provider for routine, preventive ear cleanings. Ask your health care provider how often you should schedule your cleanings.  If you have hearing aids, clean them according to instructions from the manufacturer and your health care provider. Contact a health care provider if:  You have ear pain.  You develop a fever.  You have blood, pus, or other fluid coming from your ear.  You have hearing loss.  You have ringing in your ears that does not go away.  Your symptoms do not improve with treatment.  You feel like the room is spinning (vertigo). Summary  Earwax can build up in the ear and cause discomfort or hearing loss.  The most common symptoms of this condition include reduced or muffled hearing and a feeling of fullness in the ear or feeling that the ear is plugged.  This condition may be diagnosed based on your symptoms, your medical history,  and an ear exam.  This condition may be treated by using ear drops to soften the earwax or by having the earwax removed by a health care provider.  Do not put any objects, including cotton swabs, into your ear. You can clean the opening of your ear canal with a washcloth or facial tissue. This information is not intended to replace advice given to you by your health care provider. Make sure you discuss any questions you have with your health care provider. Document Released: 08/20/2004 Document Revised: 09/23/2016 Document Reviewed:  09/23/2016 Elsevier Interactive Patient Education  [employer].

## 2017-07-02 ENCOUNTER — Ambulatory Visit (INDEPENDENT_AMBULATORY_CARE_PROVIDER_SITE_OTHER): Payer: BLUE CROSS/BLUE SHIELD | Admitting: Family Medicine

## 2017-07-02 ENCOUNTER — Encounter: Payer: Self-pay | Admitting: Family Medicine

## 2017-07-02 DIAGNOSIS — K0889 Other specified disorders of teeth and supporting structures: Secondary | ICD-10-CM | POA: Diagnosis not present

## 2017-07-02 MED ORDER — AMOXICILLIN 875 MG PO TABS: 875.0000 mg | ORAL_TABLET | Freq: Two times a day (BID) | ORAL | 0 refills | Status: DC

## 2017-07-02 NOTE — Patient Instructions (Signed)
Call you dentist to make an appointment.  Can increase your Ibuprofen to 600mg  three times a day.
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
## 2017-08-12 ENCOUNTER — Ambulatory Visit (INDEPENDENT_AMBULATORY_CARE_PROVIDER_SITE_OTHER): Payer: BLUE CROSS/BLUE SHIELD | Admitting: Family Medicine

## 2017-08-12 ENCOUNTER — Encounter: Payer: Self-pay | Admitting: Family Medicine

## 2017-08-12 VITALS — BP 123/76 | HR 84 | Temp 97.5°F | Ht 60.0 in | Wt 147.0 lb

## 2017-08-12 DIAGNOSIS — T700XXA Otitic barotrauma, initial encounter: Secondary | ICD-10-CM | POA: Diagnosis not present

## 2017-08-12 DIAGNOSIS — H6122 Impacted cerumen, left ear: Secondary | ICD-10-CM

## 2017-08-12 MED ORDER — NEOMYCIN-POLYMYXIN-HC 3.5-10000-1 OT SOLN: 3.0000 [drp] | Freq: Four times a day (QID) | OTIC | 0 refills | Status: DC

## 2017-08-12 NOTE — Patient Instructions (Signed)
Thank you for coming in today. Use the prescription ear drops.  Use debrox ear drops frequently for ear wax.  You can also use ear wash out kit with a 50/50 mix of hydrogen peroxide and warm water.  You can also use rubbing alcohol drops if you feel like there is water in your ear.   Recheck as needed.    Earwax Buildup, Adult The ears produce a substance called earwax that helps keep bacteria out of the ear and protects the skin in the ear canal. Occasionally, earwax can build up in the ear and cause discomfort or hearing loss. What increases the risk? This condition is more likely to develop in people who:  Are female.  Are elderly.  Naturally produce more earwax.  Clean their ears often with cotton swabs.  Use earplugs often.  Use in-ear headphones often.  Wear hearing aids.  Have narrow ear canals.  Have earwax that is overly thick or sticky.  Have eczema.  Are dehydrated.  Have excess hair in the ear canal.  What are the signs or symptoms? Symptoms of this condition include:  Reduced or muffled hearing.  A feeling of fullness in the ear or feeling that the ear is plugged.  Fluid coming from the ear.  Ear pain.  Ear itch.  Ringing in the ear.  Coughing.  An obvious piece of earwax that can be seen inside the ear canal.  How is this diagnosed? This condition may be diagnosed based on:  Your symptoms.  Your medical history.  An ear exam. During the exam, your health care provider will look into your ear with an instrument called an otoscope.  You may have tests, including a hearing test. How is this treated? This condition may be treated by:  Using ear drops to soften the earwax.  Having the earwax removed by a health care provider. The health care provider may: ? Flush the ear with water. ? Use an instrument that has a loop on the end (curette). ? Use a suction device.  Surgery to remove the wax buildup. This may be done in severe  cases.  Follow these instructions at home:  Take over-the-counter and prescription medicines only as told by your health care provider.  Do not put any objects, including cotton swabs, into your ear. You can clean the opening of your ear canal with a washcloth or facial tissue.  Follow instructions from your health care provider about cleaning your ears. Do not over-clean your ears.  Drink enough fluid to keep your urine clear or pale yellow. This will help to thin the earwax.  Keep all follow-up visits as told by your health care provider. If earwax builds up in your ears often or if you use hearing aids, consider seeing your health care provider for routine, preventive ear cleanings. Ask your health care provider how often you should schedule your cleanings.  If you have hearing aids, clean them according to instructions from the manufacturer and your health care provider. Contact a health care provider if:  You have ear pain.  You develop a fever.  You have blood, pus, or other fluid coming from your ear.  You have hearing loss.  You have ringing in your ears that does not go away.  Your symptoms do not improve with treatment.  You feel like the room is spinning (vertigo). Summary  Earwax can build up in the ear and cause discomfort or hearing loss.  The most common symptoms of this  condition include reduced or muffled hearing and a feeling of fullness in the ear or feeling that the ear is plugged.  This condition may be diagnosed based on your symptoms, your medical history, and an ear exam.  This condition may be treated by using ear drops to soften the earwax or by having the earwax removed by a health care provider.  Do not put any objects, including cotton swabs, into your ear. You can clean the opening of your ear canal with a washcloth or facial tissue. This information is not intended to replace advice given to you by your health care provider. Make sure you  discuss any questions you have with your health care provider. Document Released: 08/20/2004 Document Revised: 09/23/2016 Document Reviewed: 09/23/2016 Elsevier Interactive Patient Education  2018 [employer] Barotitis media is inflammation of the middle ear. This condition occurs when an auditory tube (eustachian tube) is blocked in one or both ears. These tubes lead from the middle ear to the back of the nose (nasopharynx). This condition typically occurs when you experience changes in pressure, such as when flying or scuba diving. Untreated barotitis media may lead to damage or hearing loss (barotrauma), which may become permanent. What are the causes? This condition may be caused by changes in air pressure from:  Flying.  Scuba diving.  A nearby explosion.  What increases the risk? The following factors may make you more likely to develop this condition:  Middle ear infection.  Sinus infection.  A cold.  Environmental allergies.  Small eustachian tubes.  Recent ear surgery.  What are the signs or symptoms? Symptoms of this condition may include:  Ear pain.  Hearing loss.  In severe cases, symptoms can include:  Dizziness and nausea (vertigo).  Temporary facial paralysis.  How is this diagnosed? This condition is diagnosed based on:  A physical exam. Your health care provider may: ? Use a device (otoscope) to look into your ear canal and check your eardrum. ? Do a test that changes air pressure in the middle ear to check how well the eardrum moves and to see if the eustachian tube is working(tympanogram).  Your medical history.  In some cases, your health care provider may have you take a hearing test. You may also be referred to someone who specializes in ear treatment (otolaryngologist, "ENT"). How is this treated? This condition may be treated with:  Medicines to relieve congestion in your nose, sinus, or upper respiratory tract  (decongestants).  Techniques to equalize pressure (to "pop" your ears), such as: ? Yawning. ? Chewing gum. ? Swallowing.  In severe cases, you may need surgery to relieve your symptoms or to prevent future inflammation. Follow these instructions at home:  Take over-the-counter and prescription medicines only as told by your health care provider.  Do not put anything into your ears to clean or unplug them. Ear drops will not help.  Keep all follow-up visits as told by your health care provider. This is important. How is this prevented? Using these strategies may help to prevent barotitis media:  Chewing gum with frequent, forceful swallowing during takeoff and landing when flying.  Holding your nose and gently blowing to pop your ears for equalizing pressure changes. This forces air into the eustachian tube.  Yawning during air pressure changes.  Using a nasal decongestant about 30-60 minutes before flying, if you have nasal congestion.  Contact a health care provider if:  You have vertigo.  You have hearing  loss.  Your symptoms do not get better or they get worse.  You have a fever. Get help right away if:  You have a severe headache, ear pain, and dizziness.  You have balance problems.  You cannot move or feel part of your face.  You have bloody or pus-like drainage from your ears. Summary  Barotitis media is inflammation of the middle ear.  This condition typically occurs when you experience changes in pressure, such as when flying or scuba diving.  You may be at a higher risk for this condition if you have small eustachian tubes, had recent ear surgery, or have allergies, a cold, or sinus or middle ear infection.  This condition may be treated with medicines or techniques to equalize pressure in your ears.  Strategies can be used to help prevent barotitis media. This information is not intended to replace advice given to you by your health care provider. Make  sure you discuss any questions you have with your health care provider. Document Released: 07/10/2000 Document Revised: 06/01/2016 Document Reviewed: 06/01/2016 Elsevier Interactive Patient Education  2017 [employer].

## 2017-08-12 NOTE — Progress Notes (Signed)
Cindy Dunlap is a 27 y.o. female who presents to [place] [place] today for left ear pain.  Braddock has a several day history of left ear pain decreased hearing and congestion.  Her symptoms are consistent with prior history of cerumen impaction.  She tried over-the-counter earwax drops and over-the-counter ear irrigation kit which did not help.  She notes that she also has persistent symptoms of sinus congestion and ear fullness even when her ear canals cleaned.  She has been to ENT once and received an ear cleanout but otherwise not much else.  She otherwise denies any fevers or chills vomiting or diarrhea.  She avoids pregnancy with her husband having a vasectomy.   No past medical history on file. No past surgical history on file. Social History   Tobacco Use  . Smoking status: Never Smoker  . Smokeless tobacco: Never Used  Substance Use Topics  . Alcohol use: Not on file   family history includes Cancer in her paternal grandfather; Diabetes in her paternal grandmother; Heart attack in her father, paternal grandfather, and paternal grandmother; Hyperlipidemia in her father; Hypertension in her father; Stroke in her paternal grandmother.  ROS as above:  Medications: Current Outpatient Medications  Medication Sig Dispense Refill  . ketoconazole (NIZORAL) 2 % shampoo Apply 1 application topically 2 (two) times a week. 120 mL 0  . neomycin-polymyxin-hydrocortisone (CORTISPORIN) OTIC solution Place 3 drops into the left ear 4 (four) times daily. 10 mL 0   No current facility-administered medications for this visit.    Allergies  Allergen Reactions  . Ciprofloxacin Other (See Comments)    Headache    Health Maintenance Health Maintenance  Topic Date Due  . HIV Screening  12/18/2005  . TETANUS/TDAP  12/18/2009  . INFLUENZA VACCINE  07/26/2018 (Originally 02/24/2017)       Exam:  BP 123/76   Pulse 84   Temp (!) 97.5 F (36.4 C) (Oral)   Ht 5' (1.524 m)   Wt 147 lb (66.7 kg)   BMI 28.71 kg/m  Gen: Well NAD HEENT: EOMI,  MMM left ear canal is erythematous and tender following cerumen removal.  Tympanic membrane is normal.  No cervical lymphadenopathy. Lungs: Normal work of breathing. CTABL Heart: RRR no MRG Abd: NABS, Soft. Nondistended, Nontender Exts: Brisk capillary refill, warm and well perfused.   Patient had left-sided ear irrigation which removed a large plug of wax  No results found for this or any previous visit (from the past 72 hour(s)). No results found.    Assessment and Plan: 27 y.o. female with  Left cerumen impaction and otitis externa.  This is status post irrigation today.  Treat with Cortisporin eardrops. In the future recommend continue prophylactic Debrox eardrops.  Additionally advised how to properly doing at home ear irrigation with warm water and hydrogen peroxide.  Recommend rubbing alcohol eardrops for water displacement as needed.  Additionally I suspect the patient probably has some eustachian tube dysfunction and I recommended over-the-counter Zyrtec-D or relatives of this medication.    No orders of the defined types were placed in this encounter.  Meds ordered this encounter  Medications  . neomycin-polymyxin-hydrocortisone (CORTISPORIN) OTIC solution    Sig: Place 3 drops into the left ear 4 (four) times daily.    Dispense:  10 mL    Refill:  0     Discussed warning signs or symptoms. Please see discharge instructions. Patient expresses understanding.  I  spent 25 minutes with this patient, greater than 50% was face-to-face time counseling regarding ddx and treatment plan.

## 2017-09-30 ENCOUNTER — Encounter: Payer: Self-pay | Admitting: Osteopathic Medicine

## 2017-09-30 ENCOUNTER — Ambulatory Visit (INDEPENDENT_AMBULATORY_CARE_PROVIDER_SITE_OTHER): Payer: BLUE CROSS/BLUE SHIELD | Admitting: Osteopathic Medicine

## 2017-09-30 VITALS — BP 118/75 | HR 71 | Temp 98.2°F | Wt 153.0 lb

## 2017-09-30 DIAGNOSIS — J0100 Acute maxillary sinusitis, unspecified: Secondary | ICD-10-CM

## 2017-09-30 DIAGNOSIS — H6002 Abscess of left external ear: Secondary | ICD-10-CM

## 2017-09-30 MED ORDER — AMOXICILLIN-POT CLAVULANATE 875-125 MG PO TABS: 1.0000 | ORAL_TABLET | Freq: Two times a day (BID) | ORAL | 0 refills | Status: DC

## 2017-09-30 MED ORDER — FLUTICASONE PROPIONATE 50 MCG/ACT NA SUSP: 2.0000 | Freq: Every day | NASAL | 11 refills | Status: DC

## 2017-09-30 NOTE — Progress Notes (Signed)
HPI: Cindy Dunlap is a 27 y.o. female who  has no past medical history on file.  she presents to [facility] today, 09/30/17,  for chief complaint of: L ear pain Sinus pressure  Sinus pressure ongoing for about a week or so. Left ear pain, hurts worse at tragus of her L ear which she has been scratching. Ear pressure /allergies a chronic issue    Past medical, surgical, social and family history reviewed   Current medication list and allergy/intolerance information reviewed   Review of Systems:  Constitutional:  No  fever, no chills, +recent illness, No unintentional weight changes. No significant fatigue.   HEENT: No  headache, no vision change, no hearing change, No sore throat, +sinus pressure  Cardiac: No  chest pain, No  pressure, No palpitations  Respiratory:  No  shortness of breath. No  Cough  Gastrointestinal: No  abdominal pain, No  nausea, No  vomiting,  No  blood in stool, No  diarrhea  Musculoskeletal: No new myalgia/arthralgia  Skin: No  Rash  Neurologic: No  weakness, No  dizziness  Exam:  BP 118/75   Pulse 71   Temp 98.2 F (36.8 C) (Oral)   Wt 153 lb 0.6 oz (69.4 kg)   BMI 29.89 kg/m   Constitutional: VS see above. General Appearance: alert, well-developed, well-nourished, NAD  Eyes: Normal lids and conjunctive, non-icteric sclera  Ears, Nose, Mouth, Throat: MMM, Normal external inspection ears/nares/mouth/lips/gums. TM normal bilaterally. Pharynx/tonsils no erythema, no exudate. Nasal mucosa normal. Tragus L ear posteriorly near canal has small abscess/carbuncle   Neck: No masses, trachea midline. No tenderness/mass appreciated. No lymphadenopathy  Respiratory: Normal respiratory effort. no wheeze, no rhonchi, no rales  Cardiovascular: S1/S2 normal, no murmur, no rub/gallop auscultated. RRR. No lower extremity edema.   Gastrointestinal: Nontender, no masses. Bowel sounds normal.  Musculoskeletal: Gait  normal.   Neurological: Normal balance/coordination. No tremor.   Skin: warm, dry, intact.   Psychiatric: Normal judgment/insight. Normal mood and affect.     ASSESSMENT/PLAN: The primary encounter diagnosis was Abscess of tragus of left ear. A diagnosis of Acute non-recurrent maxillary sinusitis was also pertinent to this visit.  Simple drainage abscess w/ 25G needle yielded purulent fluid.    Meds ordered this encounter  Medications  . amoxicillin-clavulanate (AUGMENTIN) 875-125 MG tablet    Sig: Take 1 tablet by mouth 2 (two) times daily.    Dispense:  14 tablet    Refill:  0  . fluticasone (FLONASE) 50 MCG/ACT nasal spray    Sig: Place 2 sprays into both nostrils daily.    Dispense:  16 g    Refill:  11      Visit summary with medication list and pertinent instructions was printed for patient to review. All questions at time of visit were answered - patient instructed to contact office with any additional concerns. ER/RTC precautions were reviewed with the patient.   Follow-up plan: Return for annual physical when due .    Please note: voice recognition software was used to produce this document, and typos may escape review. Please contact Dr. Lyn HollingsheadAlexander for any needed clarifications.

## 2018-01-04 ENCOUNTER — Ambulatory Visit (INDEPENDENT_AMBULATORY_CARE_PROVIDER_SITE_OTHER): Payer: BLUE CROSS/BLUE SHIELD | Admitting: Osteopathic Medicine

## 2018-01-04 VITALS — BP 121/80 | HR 85 | Temp 98.1°F | Wt 158.2 lb

## 2018-01-04 DIAGNOSIS — R21 Rash and other nonspecific skin eruption: Secondary | ICD-10-CM

## 2018-01-04 DIAGNOSIS — J019 Acute sinusitis, unspecified: Secondary | ICD-10-CM

## 2018-01-04 MED ORDER — BETAMETHASONE DIPROPIONATE 0.05 % EX CREA: TOPICAL_CREAM | Freq: Two times a day (BID) | CUTANEOUS | 1 refills | Status: DC

## 2018-01-04 MED ORDER — BENZONATATE 200 MG PO CAPS: 200.0000 mg | ORAL_CAPSULE | Freq: Three times a day (TID) | ORAL | 0 refills | Status: DC | PRN

## 2018-01-04 MED ORDER — IPRATROPIUM BROMIDE 0.06 % NA SOLN: 2.0000 | Freq: Four times a day (QID) | NASAL | 1 refills | Status: DC

## 2018-01-04 MED ORDER — AMOXICILLIN-POT CLAVULANATE 875-125 MG PO TABS: 1.0000 | ORAL_TABLET | Freq: Two times a day (BID) | ORAL | 0 refills | Status: DC

## 2018-01-04 MED ORDER — PREDNISONE 20 MG PO TABS: 20.0000 mg | ORAL_TABLET | Freq: Two times a day (BID) | ORAL | 0 refills | Status: DC

## 2018-01-04 NOTE — Patient Instructions (Signed)
Over-the-Counter Medications & Home Remedies for Upper Respiratory Illness  Note: the following list assumes no pregnancy, normal liver & kidney function and no other drug interactions. Dr. Cortney Beissel has highlighted medications which are safe for you to use, but these may not be appropriate for everyone. Always ask a pharmacist or qualified medical provider if you have any questions!   Aches/Pains, Fever, Headache Acetaminophen (Tylenol) 500 mg tablets - take max 2 tablets (1000 mg) every 6 hours (4 times per day)  Ibuprofen (Motrin) 200 mg tablets - take max 4 tablets (800 mg) every 6 hours*  Sinus Congestion Prescription Atrovent as directed Nasal Saline if desired Oxymetolazone (Afrin, others) sparing use due to rebound congestion, NEVER use in kids Phenylephrine (Sudafed) 10 mg tablets every 4 hours (or the 12-hour formulation)* Diphenhydramine (Benadryl) 25 mg tablets - take max 2 tablets every 4 hours  Cough & Sore Throat Prescription cough pills or syrups as directed Dextromethorphan (Robitussin, others) - cough suppressant Guaifenesin (Robitussin, Mucinex, others) - expectorant (helps cough up mucus) (Dextromethorphan and Guaifenesin also come in a combination tablet) Lozenges w/ Benzocaine + Menthol (Cepacol) Honey - as much as you want! Teas which "coat the throat" - look for ingredients Elm Bark, Licorice Root, Marshmallow Root  Other Antibiotics if these are needed - take ALL, even if you're feeling better  Zinc Lozenges within 24 hours of symptoms onset - mixed evidence this shortens the duration of the common cold Don't waste your money on Vitamin C or Echinacea  *Caution in patients with high blood pressure    

## 2018-01-04 NOTE — Progress Notes (Signed)
HPI: Cindy Dunlap is a 27 y.o. female who  has no past medical history on file.  she presents to [facility] today, 01/04/18,  for chief complaint of: Sinus  Sinus symptoms about a week. Benadryl and Tylenol Sinus not very helpful. (+)sick contacts - her husband is better but she isn't. Couhging and phlegm. Sinus congestion and chest congestion.No fever. .    Past medical, surgical, social and family history reviewed:  Patient Active Problem List   Diagnosis Date Noted  . Birth control 01/14/2016  . History of anemia 01/14/2016  . Family history of cardiac disorder 01/14/2016    No past surgical history on file.  Social History   Tobacco Use  . Smoking status: Never Smoker  . Smokeless tobacco: Never Used  Substance Use Topics  . Alcohol use: Not on file    Family History  Problem Relation Age of Onset  . Hyperlipidemia Father   . Hypertension Father   . Diabetes Paternal Grandmother   . Stroke Paternal Grandmother   . Cancer Paternal Grandfather        LIVER  . Heart attack Father   . Heart attack Paternal Grandfather   . Heart attack Paternal Grandmother      Current medication list and allergy/intolerance information reviewed:    Current Outpatient Medications  Medication Sig Dispense Refill  . amoxicillin-clavulanate (AUGMENTIN) 875-125 MG tablet Take 1 tablet by mouth 2 (two) times daily. 14 tablet 0  . fluticasone (FLONASE) 50 MCG/ACT nasal spray Place 2 sprays into both nostrils daily. 16 g 11  . ketoconazole (NIZORAL) 2 % shampoo Apply 1 application topically 2 (two) times a week. (Patient not taking: Reported on 09/30/2017) 120 mL 0  . neomycin-polymyxin-hydrocortisone (CORTISPORIN) OTIC solution Place 3 drops into the left ear 4 (four) times daily. (Patient not taking: Reported on 09/30/2017) 10 mL 0   No current facility-administered medications for this visit.     Allergies  Allergen Reactions  . Ciprofloxacin Other  (See Comments)    Headache      Review of Systems:  Constitutional:  No  fever, no chills, +recent illness, No unintentional weight changes. +significant fatigue.   HEENT: +headache, no vision change, no hearing change, +sore throat, +sinus pressure  Cardiac: No  chest pain, No  pressure, No palpitations  Respiratory:  No  shortness of breath. +Cough  Gastrointestinal: No  abdominal pain, No  nausea, No  vomiting,  No  blood in stool, No  diarrhea  Musculoskeletal: No new myalgia/arthralgia  Skin: +Rash  Neurologic: No  weakness, No  dizziness  Exam:  BP 121/80 (BP Location: Left Arm, Patient Position: Sitting, Cuff Size: Normal)   Pulse 85   Temp 98.1 F (36.7 C) (Oral)   Wt 158 lb 3.2 oz (71.8 kg)   SpO2 99%   BMI 30.90 kg/m   Constitutional: VS see above. General Appearance: alert, well-developed, well-nourished, NAD  Eyes: Normal lids and conjunctive, non-icteric sclera  Ears, Nose, Mouth, Throat: MMM, Normal external inspection ears/nares/mouth/lips/gums. TM normal bilaterally. Pharynx/tonsils no erythema, no exudate. Nasal mucosa normal.   Neck: No masses, trachea midline. No tenderness/mass appreciated. No lymphadenopathy  Respiratory: Normal respiratory effort. no wheeze, no rhonchi, no rales  Cardiovascular: S1/S2 normal, no murmur, no rub/gallop auscultated. RRR. No lower extremity edema.   Gastrointestinal: Nontender, no masses. Bowel sounds normal.  Musculoskeletal: Gait normal.   Neurological: Normal balance/coordination. No tremor.   Skin: warm, dry, intact.  Purpleish maculopapular rash right  hand and left thigh  Psychiatric: Normal judgment/insight. Normal mood and affect.     ASSESSMENT/PLAN:   Acute non-recurrent sinusitis, unspecified location  Rash and nonspecific skin eruption - hx granuloma annulare, trial steroids and consider derm referral vs bx if no improvement    Meds ordered this encounter  Medications  . ipratropium  (ATROVENT) 0.06 % nasal spray    Sig: Place 2 sprays into both nostrils 4 (four) times daily.    Dispense:  15 mL    Refill:  1  . benzonatate (TESSALON) 200 MG capsule    Sig: Take 1 capsule (200 mg total) by mouth 3 (three) times daily as needed for cough.    Dispense:  30 capsule    Refill:  0  . predniSONE (DELTASONE) 20 MG tablet    Sig: Take 1 tablet (20 mg total) by mouth 2 (two) times daily with a meal.    Dispense:  10 tablet    Refill:  0  . amoxicillin-clavulanate (AUGMENTIN) 875-125 MG tablet    Sig: Take 1 tablet by mouth 2 (two) times daily. Fill if sinus symptoms worsen or persist 7 - 10 days    Dispense:  14 tablet    Refill:  0  . betamethasone dipropionate (DIPROLENE) 0.05 % cream    Sig: Apply topically 2 (two) times daily. To affected area(s) as needed for 2-4 weeks    Dispense:  45 g    Refill:  1    Patient Instructions  Over-the-Counter Medications & Home Remedies for Upper Respiratory Illness  Note: the following list assumes no pregnancy, normal liver & kidney function and no other drug interactions. Dr. Lyn HollingsheadAlexander has highlighted medications which are safe for you to use, but these may not be appropriate for everyone. Always ask a pharmacist or qualified medical provider if you have any questions!   Aches/Pains, Fever, Headache Acetaminophen (Tylenol) 500 mg tablets - take max 2 tablets (1000 mg) every 6 hours (4 times per day)  Ibuprofen (Motrin) 200 mg tablets - take max 4 tablets (800 mg) every 6 hours*  Sinus Congestion Prescription Atrovent as directed Nasal Saline if desired Oxymetolazone (Afrin, others) sparing use due to rebound congestion, NEVER use in kids Phenylephrine (Sudafed) 10 mg tablets every 4 hours (or the 12-hour formulation)* Diphenhydramine (Benadryl) 25 mg tablets - take max 2 tablets every 4 hours  Cough & Sore Throat Prescription cough pills or syrups as directed Dextromethorphan (Robitussin, others) - cough  suppressant Guaifenesin (Robitussin, Mucinex, others) - expectorant (helps cough up mucus) (Dextromethorphan and Guaifenesin also come in a combination tablet) Lozenges w/ Benzocaine + Menthol (Cepacol) Honey - as much as you want! Teas which "coat the throat" - look for ingredients Elm Bark, Licorice Root, Marshmallow Root  Other Antibiotics if these are needed - take ALL, even if you're feeling better  Zinc Lozenges within 24 hours of symptoms onset - mixed evidence this shortens the duration of the common cold Don't waste your money on Vitamin C or Echinacea  *Caution in patients with high blood pressure       Visit summary with medication list and pertinent instructions was printed for patient to review. All questions at time of visit were answered - patient instructed to contact office with any additional concerns. ER/RTC precautions were reviewed with the patient.   Follow-up plan: Return if symptoms worsen or fail to improve.    Please note: voice recognition software was used to produce this document, and typos may escape  review. Please contact Dr. Lyn Hollingshead for any needed clarifications.

## 2018-01-06 ENCOUNTER — Encounter: Payer: Self-pay | Admitting: Osteopathic Medicine

## 2018-04-07 DIAGNOSIS — N920 Excessive and frequent menstruation with regular cycle: Secondary | ICD-10-CM | POA: Diagnosis not present

## 2018-04-07 DIAGNOSIS — Z01411 Encounter for gynecological examination (general) (routine) with abnormal findings: Secondary | ICD-10-CM | POA: Diagnosis not present

## 2018-04-07 DIAGNOSIS — Z01419 Encounter for gynecological examination (general) (routine) without abnormal findings: Secondary | ICD-10-CM | POA: Diagnosis not present

## 2018-04-07 DIAGNOSIS — Z6824 Body mass index (BMI) 24.0-24.9, adult: Secondary | ICD-10-CM | POA: Diagnosis not present

## 2018-06-01 ENCOUNTER — Encounter: Payer: Self-pay | Admitting: Osteopathic Medicine

## 2018-06-01 ENCOUNTER — Ambulatory Visit (INDEPENDENT_AMBULATORY_CARE_PROVIDER_SITE_OTHER): Payer: BLUE CROSS/BLUE SHIELD | Admitting: Osteopathic Medicine

## 2018-06-01 VITALS — BP 114/75 | HR 66 | Temp 98.0°F | Wt 160.1 lb

## 2018-06-01 DIAGNOSIS — R5383 Other fatigue: Secondary | ICD-10-CM

## 2018-06-01 DIAGNOSIS — Z0000 Encounter for general adult medical examination without abnormal findings: Secondary | ICD-10-CM

## 2018-06-01 NOTE — Patient Instructions (Addendum)
General Preventive Care  Most recent routine screening lipids/other labs: ordered today.    Everyone should have blood pressure checked once per year.   Tobacco: don't! Alcohol: responsible moderation is ok for most adults - if you have concerns about your alcohol intake, please talk to me! Recreational/Illicit Drugs: don't!  Exercise: as tolerated to reduce risk of cardiovascular disease and diabetes. Strength training will also prevent osteoporosis.   Mental health: if need for mental health care (medicines, counseling, other), or concerns about moods, please let me know!   Sexual health: if need for STD testing, or if concerns with libido/pain problems, please let me know! If you need to discuss your birth control options, please let me know!  Vaccines  Flu vaccine: recommended for almost everyone, every fall (by Halloween! Flu is scary!), especially if you are pregnant, if you have exposure to the public, if you're around young children or the elderly, or if you're around pregnant people.   Shingles vaccine: Shingrix recommended after age 39   Pneumonia vaccines: Prevnar and Pneumovax recommended after age 17, sooner if diabetes, COPD/asthma, others  Tetanus booster: Tdap recommended every 10 years Cancer screenings   Colon cancer screening: recommended for everyone at age 50, but some folks need a colonoscopy sooner if risk factors   Breast cancer screening: mammogram recommended at age 11 - screening sooner depending on family history   Cervical cancer screening: next Pap due 11/2019  Lung cancer screening: not needed for non-smokers  Infection screenings . HIV: recommended screening at least once age 21-65, more often if risk factors  . Gonorrhea/Chlamydia: screening as needed, though many insurances require testing for anyone on birth control pills . Hepatitis C: recommended for anyone born 75-1965 . TB: certain at-risk populations, or depending on work requirements  and/or travel history Other . Bone Density Test: recommended for women at age 82, men at age 42, sooner depending on risk factors . Advanced Directive: Living Will and/or Healthcare Power of Attorney recommended for all adults, regardless of age or health!  Thyroid and Vitamin D: routine screening is not medically necessary, therefore most insurance will not cover this test as part of "free labs" on your annual physical. If you desire this testing, you may be charged for it! Please check with the lab before your blood draw if you're concerned about cost.         Over-the-Counter Medications & Home Remedies for Upper Respiratory Illness  Note: the following list assumes no pregnancy, normal liver & kidney function and no other drug interactions. Dr. Lyn Hollingshead has highlighted medications which are safe for you to use, but these may not be appropriate for everyone. Always ask a pharmacist or qualified medical provider if you have any questions!   Aches/Pains, Fever, Headache Acetaminophen (Tylenol) 500 mg tablets - take max 2 tablets (1000 mg) every 6 hours (4 times per day)  Ibuprofen (Motrin) 200 mg tablets - take max 4 tablets (800 mg) every 6 hours*  Sinus Congestion Prescription Atrovent as directed Cromolyn Nasal Spray (NasalCrom) 1 spray each nostril 3-4 times per day, max 6 imes per day Nasal Saline if desired Oxymetolazone (Afrin, others) sparing use due to rebound congestion, NEVER use in kids Phenylephrine (Sudafed) 10 mg tablets every 4 hours (or the 12-hour formulation)* Diphenhydramine (Benadryl) 25 mg tablets - take max 2 tablets every 4 hours  Cough & Sore Throat Prescription cough pills or syrups as directed Dextromethorphan (Robitussin, others) - cough suppressant Guaifenesin (Robitussin, Mucinex, others) -  expectorant (helps cough up mucus) (Dextromethorphan and Guaifenesin also come in a combination tablet) Lozenges w/ Benzocaine + Menthol (Cepacol) Honey - as much  as you want! Teas which "coat the throat" - look for ingredients Elm Bark, Licorice Root, Marshmallow Root  Other Antibiotics if these are prescribed - take ALL, even if you're feeling better  Zinc Lozenges within 24 hours of symptoms onset - mixed evidence this shortens the duration of the common cold Don't waste your money on Vitamin C or Echinacea  *Caution in patients with high blood pressure      There is no immunization history on file for this patient.

## 2018-06-01 NOTE — Progress Notes (Signed)
HPI: Cindy Dunlap is a 27 y.o. female who  has no past medical history on file.  she presents to [facility] today, 06/01/18,  for chief complaint of: Annual Physical     Patient here for annual physical / wellness exam.  See preventive care reviewed as below.  Recent labs reviewed in detail with the patient.   Additional concerns today include:  Feeling more fatigued lately, trying to make improvements with diet/exercise     Past medical, surgical, social and family history reviewed:  Patient Active Problem List   Diagnosis Date Noted  . Birth control 01/14/2016  . History of anemia 01/14/2016  . Family history of cardiac disorder 01/14/2016   No past surgical history on file.  Social History   Tobacco Use  . Smoking status: Never Smoker  . Smokeless tobacco: Never Used  Substance Use Topics  . Alcohol use: Not on file    Family History  Problem Relation Age of Onset  . Hyperlipidemia Father   . Hypertension Father   . Diabetes Paternal Grandmother   . Stroke Paternal Grandmother   . Cancer Paternal Grandfather        LIVER  . Heart attack Father   . Heart attack Paternal Grandfather   . Heart attack Paternal Grandmother      Current medication list and allergy/intolerance information reviewed:    Current Outpatient Medications  Medication Sig Dispense Refill  . betamethasone dipropionate (DIPROLENE) 0.05 % cream Apply topically 2 (two) times daily. To affected area(s) as needed for 2-4 weeks 45 g 1  . fluticasone (FLONASE) 50 MCG/ACT nasal spray Place 2 sprays into both nostrils daily. 16 g 11   No current facility-administered medications for this visit.     Allergies  Allergen Reactions  . Ciprofloxacin Other (See Comments)    Headache Headache      Review of Systems:  Constitutional:  No  fever, no chills, No recent illness, No unintentional weight changes. +significant fatigue.   HEENT: No  headache, no  vision change, no hearing change, No sore throat, No  sinus pressure  Cardiac: No  chest pain, No  pressure, No palpitations, No  Orthopnea  Respiratory:  No  shortness of breath. No  Cough  Gastrointestinal: No  abdominal pain, No  nausea, No  vomiting,  No  blood in stool, No  diarrhea, No  constipation   Musculoskeletal: No new myalgia/arthralgia  Skin: No  Rash, No other wounds/concerning lesions  Genitourinary: No  incontinence, No  abnormal genital bleeding, No abnormal genital discharge  Hem/Onc: No  easy bruising/bleeding, No  abnormal lymph node  Endocrine: No cold intolerance,  No heat intolerance. No polyuria/polydipsia/polyphagia   Neurologic: No  weakness, No  dizziness  Psychiatric: No  concerns with depression, No  concerns with anxiety, No sleep problems, No mood problems  Exam:  BP 114/75 (BP Location: Left Arm, Patient Position: Sitting, Cuff Size: Normal)   Pulse 66   Temp 98 F (36.7 C) (Oral)   Wt 160 lb 1.6 oz (72.6 kg)   BMI 31.27 kg/m   Constitutional: VS see above. General Appearance: alert, well-developed, well-nourished, NAD  Eyes: Normal lids and conjunctive, non-icteric sclera  Ears, Nose, Mouth, Throat: MMM, Normal external inspection ears/nares/mouth/lips/gums. TM normal bilaterally. Pharynx/tonsils no erythema, no exudate. Nasal mucosa normal.   Neck: No masses, trachea midline. No thyroid enlargement. No tenderness/mass appreciated. No lymphadenopathy  Respiratory: Normal respiratory effort. no wheeze, no rhonchi, no rales  Cardiovascular: S1/S2 normal, no murmur, no rub/gallop auscultated. RRR. No lower extremity edema.   Gastrointestinal: Nontender, no masses. No hepatomegaly, no splenomegaly. No hernia appreciated. Bowel sounds normal. Rectal exam deferred.   Musculoskeletal: Gait normal. No clubbing/cyanosis of digits.   Neurological: Normal balance/coordination. No tremor. No cranial nerve deficit on limited exam. Motor and  sensation intact and symmetric. Cerebellar reflexes intact.   Skin: warm, dry, intact. No rash/ulcer. No concerning nevi or subq nodules on limited exam.    Psychiatric: Normal judgment/insight. Normal mood and affect. Oriented x3.      ASSESSMENT/PLAN:   Annual physical exam - Plan: CBC, COMPLETE METABOLIC PANEL WITH GFR, Lipid panel, TSH, T4, free, VITAMIN D 25 Hydroxy (Vit-D Deficiency, Fractures)  Fatigue, unspecified type - Plan: TSH, T4, free, VITAMIN D 25 Hydroxy (Vit-D Deficiency, Fractures)    Patient Instructions  General Preventive Care  Most recent routine screening lipids/other labs: ordered today.    Everyone should have blood pressure checked once per year.   Tobacco: don't! Alcohol: responsible moderation is ok for most adults - if you have concerns about your alcohol intake, please talk to me! Recreational/Illicit Drugs: don't!  Exercise: as tolerated to reduce risk of cardiovascular disease and diabetes. Strength training will also prevent osteoporosis.   Mental health: if need for mental health care (medicines, counseling, other), or concerns about moods, please let me know!   Sexual health: if need for STD testing, or if concerns with libido/pain problems, please let me know! If you need to discuss your birth control options, please let me know!  Vaccines  Flu vaccine: recommended for almost everyone, every fall (by Halloween! Flu is scary!), especially if you are pregnant, if you have exposure to the public, if you're around young children or the elderly, or if you're around pregnant people.   Shingles vaccine: Shingrix recommended after age 52   Pneumonia vaccines: Prevnar and Pneumovax recommended after age 50, sooner if diabetes, COPD/asthma, others  Tetanus booster: Tdap recommended every 10 years Cancer screenings   Colon cancer screening: recommended for everyone at age 36, but some folks need a colonoscopy sooner if risk factors   Breast cancer  screening: mammogram recommended at age 36 - screening sooner depending on family history   Cervical cancer screening: next Pap due 11/2019  Lung cancer screening: not needed for non-smokers  Infection screenings . HIV: recommended screening at least once age 31-65, more often if risk factors  . Gonorrhea/Chlamydia: screening as needed, though many insurances require testing for anyone on birth control pills . Hepatitis C: recommended for anyone born 76-1965 . TB: certain at-risk populations, or depending on work requirements and/or travel history Other . Bone Density Test: recommended for women at age 71, men at age 103, sooner depending on risk factors . Advanced Directive: Living Will and/or Healthcare Power of Attorney recommended for all adults, regardless of age or health!  Thyroid and Vitamin D: routine screening is not medically necessary, therefore most insurance will not cover this test as part of "free labs" on your annual physical. If you desire this testing, you may be charged for it! Please check with the lab before your blood draw if you're concerned about cost.         Over-the-Counter Medications & Home Remedies for Upper Respiratory Illness  Note: the following list assumes no pregnancy, normal liver & kidney function and no other drug interactions. Dr. Lyn Hollingshead has highlighted medications which are safe for you to use, but these may  not be appropriate for everyone. Always ask a pharmacist or qualified medical provider if you have any questions!   Aches/Pains, Fever, Headache Acetaminophen (Tylenol) 500 mg tablets - take max 2 tablets (1000 mg) every 6 hours (4 times per day)  Ibuprofen (Motrin) 200 mg tablets - take max 4 tablets (800 mg) every 6 hours*  Sinus Congestion Prescription Atrovent as directed Cromolyn Nasal Spray (NasalCrom) 1 spray each nostril 3-4 times per day, max 6 imes per day Nasal Saline if desired Oxymetolazone (Afrin, others) sparing use  due to rebound congestion, NEVER use in kids Phenylephrine (Sudafed) 10 mg tablets every 4 hours (or the 12-hour formulation)* Diphenhydramine (Benadryl) 25 mg tablets - take max 2 tablets every 4 hours  Cough & Sore Throat Prescription cough pills or syrups as directed Dextromethorphan (Robitussin, others) - cough suppressant Guaifenesin (Robitussin, Mucinex, others) - expectorant (helps cough up mucus) (Dextromethorphan and Guaifenesin also come in a combination tablet) Lozenges w/ Benzocaine + Menthol (Cepacol) Honey - as much as you want! Teas which "coat the throat" - look for ingredients Elm Bark, Licorice Root, Marshmallow Root  Other Antibiotics if these are prescribed - take ALL, even if you're feeling better  Zinc Lozenges within 24 hours of symptoms onset - mixed evidence this shortens the duration of the common cold Don't waste your money on Vitamin C or Echinacea  *Caution in patients with high blood pressure      There is no immunization history on file for this patient.     Visit summary with medication list and pertinent instructions was printed for patient to review. All questions at time of visit were answered - patient instructed to contact office with any additional concerns. ER/RTC precautions were reviewed with the patient.   Follow-up plan: Return in about 1 year (around 06/02/2019) for annual wellnes check-up / physical, sooner if needed .    Please note: voice recognition software was used to produce this document, and typos may escape review. Please contact Dr. Lyn Hollingshead for any needed clarifications.

## 2018-06-02 LAB — COMPLETE METABOLIC PANEL WITH GFR
AG Ratio: 1.8 (calc) (ref 1.0–2.5)
ALT: 7 U/L (ref 6–29)
AST: 12 U/L (ref 10–30)
Albumin: 4.7 g/dL (ref 3.6–5.1)
Alkaline phosphatase (APISO): 63 U/L (ref 33–115)
BILIRUBIN TOTAL: 0.6 mg/dL (ref 0.2–1.2)
BUN: 15 mg/dL (ref 7–25)
CHLORIDE: 105 mmol/L (ref 98–110)
CO2: 26 mmol/L (ref 20–32)
Calcium: 9.3 mg/dL (ref 8.6–10.2)
Creat: 0.87 mg/dL (ref 0.50–1.10)
GFR, EST AFRICAN AMERICAN: 106 mL/min/{1.73_m2} (ref >=60)
GFR, Est Non African American: 91 mL/min/{1.73_m2} (ref >=60)
GLUCOSE: 91 mg/dL (ref 65–99)
Globulin: 2.6 g/dL (calc) (ref 1.9–3.7)
Potassium: 3.9 mmol/L (ref 3.5–5.3)
Sodium: 139 mmol/L (ref 135–146)
TOTAL PROTEIN: 7.3 g/dL (ref 6.1–8.1)

## 2018-06-02 LAB — TSH: TSH: 1.82 m[IU]/L

## 2018-06-02 LAB — T4, FREE: Free T4: 1 ng/dL (ref 0.8–1.8)

## 2018-06-02 LAB — CBC
HCT: 40.3 % (ref 35.0–45.0)
Hemoglobin: 13.4 g/dL (ref 11.7–15.5)
MCH: 29.8 pg (ref 27.0–33.0)
MCHC: 33.3 g/dL (ref 32.0–36.0)
MCV: 89.8 fL (ref 80.0–100.0)
MPV: 11.5 fL (ref 7.5–12.5)
PLATELETS: 193 10*3/uL (ref 140–400)
RBC: 4.49 10*6/uL (ref 3.80–5.10)
RDW: 11.8 % (ref 11.0–15.0)
WBC: 5.7 10*3/uL (ref 3.8–10.8)

## 2018-06-02 LAB — LIPID PANEL
CHOL/HDL RATIO: 3.5 (calc) (ref <5.0)
CHOLESTEROL: 193 mg/dL (ref <200)
HDL: 55 mg/dL (ref >50)
LDL CHOLESTEROL (CALC): 121 mg/dL — AB
Non-HDL Cholesterol (Calc): 138 mg/dL (calc) — ABNORMAL HIGH (ref <130)
Triglycerides: 75 mg/dL (ref <150)

## 2018-06-02 LAB — VITAMIN D 25 HYDROXY (VIT D DEFICIENCY, FRACTURES): VIT D 25 HYDROXY: 27 ng/mL — AB (ref 30–100)

## 2018-07-28 ENCOUNTER — Encounter: Payer: Self-pay | Admitting: Physician Assistant

## 2018-07-28 ENCOUNTER — Ambulatory Visit (INDEPENDENT_AMBULATORY_CARE_PROVIDER_SITE_OTHER): Payer: BLUE CROSS/BLUE SHIELD | Admitting: Physician Assistant

## 2018-07-28 VITALS — BP 127/87 | HR 97 | Temp 98.5°F | Wt 167.0 lb

## 2018-07-28 DIAGNOSIS — Z87440 Personal history of urinary (tract) infections: Secondary | ICD-10-CM | POA: Diagnosis not present

## 2018-07-28 DIAGNOSIS — R82998 Other abnormal findings in urine: Secondary | ICD-10-CM

## 2018-07-28 DIAGNOSIS — R110 Nausea: Secondary | ICD-10-CM | POA: Diagnosis not present

## 2018-07-28 DIAGNOSIS — R1013 Epigastric pain: Secondary | ICD-10-CM | POA: Diagnosis not present

## 2018-07-28 DIAGNOSIS — R1030 Lower abdominal pain, unspecified: Secondary | ICD-10-CM | POA: Diagnosis not present

## 2018-07-28 LAB — POCT URINALYSIS DIPSTICK
Bilirubin, UA: NEGATIVE
Blood, UA: NEGATIVE
Glucose, UA: NEGATIVE
Ketones, UA: NEGATIVE
NITRITE UA: NEGATIVE
PH UA: 7.5 (ref 5.0–8.0)
PROTEIN UA: NEGATIVE
Spec Grav, UA: 1.02 (ref 1.010–1.025)
UROBILINOGEN UA: 1 U/dL

## 2018-07-28 LAB — POCT URINE PREGNANCY: Preg Test, Ur: NEGATIVE

## 2018-07-28 MED ORDER — OMEPRAZOLE 40 MG PO CPDR: 40.0000 mg | DELAYED_RELEASE_CAPSULE | Freq: Every day | ORAL | 0 refills | Status: DC

## 2018-07-28 MED ORDER — CEPHALEXIN 500 MG PO CAPS: 500.0000 mg | ORAL_CAPSULE | Freq: Two times a day (BID) | ORAL | 0 refills | Status: DC

## 2018-07-28 NOTE — Progress Notes (Signed)
HPI:                                                                Cindy Dunlap is a 28 y.o. female who presents to [facility] Cindy Dunlap: Primary Care Sports Medicine today for abdominal pain / bilateral ear fullness  Abdominal Pain  This is a new problem. The current episode started in the past 7 days (x 3 days). The onset quality is undetermined. The pain is located in the suprapubic region. The abdominal pain radiates to the back. Associated symptoms include nausea. Pertinent negatives include no anorexia, diarrhea, dysuria, fever, hematochezia, hematuria, melena, vomiting or weight loss. Associated symptoms comments: + early satiety + "choking" episodes. Nothing aggravates the pain. The pain is relieved by nothing. She has tried nothing for the symptoms. + kidney stone  Hx of 4 mm renal stone in 2017  She also c/o bilateral ear discomfort, waxing and waning. No persistent otalgia, fever, otorrhea or hearing loss. Reports hx of cerumen impaction.   History reviewed. No pertinent past medical history. History reviewed. No pertinent surgical history. Social History   Tobacco Use  . Smoking status: Never Smoker  . Smokeless tobacco: Never Used  Substance Use Topics  . Alcohol use: Not on file   family history includes Cancer in her paternal grandfather; Colon cancer in her maternal uncle; Dementia in her maternal grandfather; Diabetes in her paternal grandmother; Heart attack in her father, paternal grandfather, and paternal grandmother; Hyperlipidemia in her father; Hypertension in her father; Lymphoma in her maternal grandmother; Stroke in her paternal grandmother.    ROS: negative except as noted in the HPI  Medications: Current Outpatient Medications  Medication Sig Dispense Refill  . betamethasone dipropionate (DIPROLENE) 0.05 % cream Apply topically 2 (two) times daily. To affected area(s) as needed for 2-4 weeks 45 g 1  . cephALEXin (KEFLEX) 500 MG capsule Take 1  capsule (500 mg total) by mouth 2 (two) times daily. 14 capsule 0  . fluticasone (FLONASE) 50 MCG/ACT nasal spray Place 2 sprays into both nostrils daily. 16 g 11  . omeprazole (PRILOSEC) 40 MG capsule Take 1 capsule (40 mg total) by mouth daily. 30 capsule 0   No current facility-administered medications for this visit.    Allergies  Allergen Reactions  . Ciprofloxacin Other (See Comments)    Headache        Objective:  BP 127/87   Pulse 97   Temp 98.5 F (36.9 C) (Oral)   Wt 167 lb (75.8 kg)   BMI 32.61 kg/m  Gen:  alert, not ill-appearing, no distress, appropriate for age, obese female HEENT: head normocephalic without obvious abnormality, conjunctiva and cornea clear, ear canals obstructed by cerumen bilaterally trachea midline Pulm: Normal work of breathing, normal phonation, clear to auscultation bilaterally, no wheezes, rales or rhonchi CV: Normal rate, regular rhythm, s1 and s2 distinct, no murmurs, clicks or rubs  GI: abdomen soft, epigastric tenderness, no guarding or rigidity, no CVA tenderness Neuro: alert and oriented x 3, no tremor MSK: extremities atraumatic, normal gait and station Skin: intact, no rashes on exposed skin, no jaundice, no cyanosis  Procedure: bilateral ear irrigation Indication: aural fullness/discomfort due to cerumen impaction Verbal consent obtain Ear canals soaked with Colace for 15 minutes Ear  irrigated with peroxide/H2O by Cindy Dunlap, RMA.  Manual debridement was not performed Patient tolerated the procedure without any complications. Post-irrigation examination shows cerumen was completely removed. TM clear without erythema or perforation.     Results for orders placed or performed in visit on 07/28/18 (from the past 72 hour(s))  POCT urine pregnancy     Status: Normal   Collection Time: 07/28/18  3:50 PM  Result Value Ref Range   Preg Test, Ur Negative Negative  POCT Urinalysis Dipstick     Status: Abnormal   Collection Time:  07/28/18  3:51 PM  Result Value Ref Range   Color, UA yellow    Clarity, UA clear    Glucose, UA Negative Negative   Bilirubin, UA negative    Ketones, UA negative    Spec Grav, UA 1.020 1.010 - 1.025   Blood, UA negative    pH, UA 7.5 5.0 - 8.0   Protein, UA Negative Negative   Urobilinogen, UA 1.0 0.2 or 1.0 E.U./dL   Nitrite, UA negative    Leukocytes, UA Trace (A) Negative   Appearance     Odor     No results found.    Assessment and Plan: 28 y.o. female with   .Cindy Dunlap was seen today for urinary tract infection and cerumen impaction.  Diagnoses and all orders for this visit:  Epigastric pain determined by examination -     omeprazole (PRILOSEC) 40 MG capsule; Take 1 capsule (40 mg total) by mouth daily.  Nausea -     POCT urine pregnancy -     POCT Urinalysis Dipstick  Leukocytes in urine -     Urine Culture -     cephALEXin (KEFLEX) 500 MG capsule; Take 1 capsule (500 mg total) by mouth 2 (two) times daily.  History of urinary tract infection   Epigastric pain/Nausea - hx of single "choking episode," early satiety and persistent nausea w/o vomiting - no weight loss, odynophagia, forceful vomiting, melena/hematochezia - treating for GERD with 2 week trial of high dose PPI - counseled on GERD diet/lifestyle - follow-up with PCP In 2 weeks  Leukocytes in urine - afebrile, no tachypnea, no tachycardia, no CVA tenderness - POC hCG negative - UA + for trace leuks, otherwise negative - patient reports that pelvic cramping and nausea are her typical UTI symptoms - urine cx pending - treating empirically for uncomplicated cystitis with Keflex  Bilateral cerumen impactions - ear irrigations performed in office today as above - patient tolerated procedure well without any complications - post-irrigation exam shows air-fluid levels bilaterally - counseled on treatment for eustachian tube dysfunction. Declined referral to ENT  Patient education and anticipatory  guidance given Patient agrees with treatment plan Follow-up in 2 weeks for epigastric pain/nausea or sooner as needed if symptoms worsen or fail to improve  Levonne Hubertharley E. Tlaloc Taddei PA-C

## 2018-07-28 NOTE — Patient Instructions (Signed)
Food Choices for Gastroesophageal Reflux Disease, Adult  When you have gastroesophageal reflux disease (GERD), the foods you eat and your eating habits are very important. Choosing the right foods can help ease the discomfort of GERD. Consider working with a diet and nutrition specialist (dietitian) to help you make healthy food choices.  What general guidelines should I follow?    Eating plan  · Choose healthy foods low in fat, such as fruits, vegetables, whole grains, low-fat dairy products, and lean meat, fish, and poultry.  · Eat frequent, small meals instead of three large meals each day. Eat your meals slowly, in a relaxed setting. Avoid bending over or lying down until 2-3 hours after eating.  · Limit high-fat foods such as fatty meats or fried foods.  · Limit your intake of oils, butter, and shortening to less than 8 teaspoons each day.  · Avoid the following:  ? Foods that cause symptoms. These may be different for different people. Keep a food diary to keep track of foods that cause symptoms.  ? Alcohol.  ? Drinking large amounts of liquid with meals.  ? Eating meals during the 2-3 hours before bed.  · Cook foods using methods other than frying. This may include baking, grilling, or broiling.  Lifestyle  · Maintain a healthy weight. Ask your health care provider what weight is healthy for you. If you need to lose weight, work with your health care provider to do so safely.  · Exercise for at least 30 minutes on 5 or more days each week, or as told by your health care provider.  · Avoid wearing clothes that fit tightly around your waist and chest.  · Do not use any products that contain nicotine or tobacco, such as cigarettes and e-cigarettes. If you need help quitting, ask your health care provider.  · Sleep with the head of your bed raised. Use a wedge under the mattress or blocks under the bed frame to raise the head of the bed.  What foods are not recommended?  The items listed may not be a complete  list. Talk with your dietitian about what dietary choices are best for you.  Grains  Pastries or quick breads with added fat. French toast.  Vegetables  Deep fried vegetables. French fries. Any vegetables prepared with added fat. Any vegetables that cause symptoms. For some people this may include tomatoes and tomato products, chili peppers, onions and garlic, and horseradish.  Fruits  Any fruits prepared with added fat. Any fruits that cause symptoms. For some people this may include citrus fruits, such as oranges, grapefruit, pineapple, and lemons.  Meats and other protein foods  High-fat meats, such as fatty beef or pork, hot dogs, ribs, ham, sausage, salami and bacon. Fried meat or protein, including fried fish and fried chicken. Nuts and nut butters.  Dairy  Whole milk and chocolate milk. Sour cream. Cream. Ice cream. Cream cheese. Milk shakes.  Beverages  Coffee and tea, with or without caffeine. Carbonated beverages. Sodas. Energy drinks. Fruit juice made with acidic fruits (such as orange or grapefruit). Tomato juice. Alcoholic drinks.  Fats and oils  Butter. Margarine. Shortening. Ghee.  Sweets and desserts  Chocolate and cocoa. Donuts.  Seasoning and other foods  Pepper. Peppermint and spearmint. Any condiments, herbs, or seasonings that cause symptoms. For some people, this may include curry, hot sauce, or vinegar-based salad dressings.  Summary  · When you have gastroesophageal reflux disease (GERD), food and lifestyle choices are very   important to help ease the discomfort of GERD.  · Eat frequent, small meals instead of three large meals each day. Eat your meals slowly, in a relaxed setting. Avoid bending over or lying down until 2-3 hours after eating.  · Limit high-fat foods such as fatty meat or fried foods.  This information is not intended to replace advice given to you by your health care provider. Make sure you discuss any questions you have with your health care provider.  Document Released:  07/13/2005 Document Revised: 07/14/2016 Document Reviewed: 07/14/2016  Elsevier Interactive Patient Education © 2019 [employer] Inc[employer]

## 2018-07-29 ENCOUNTER — Encounter: Payer: Self-pay | Admitting: Physician Assistant

## 2018-07-29 DIAGNOSIS — R110 Nausea: Secondary | ICD-10-CM | POA: Insufficient documentation

## 2018-07-29 DIAGNOSIS — R1013 Epigastric pain: Secondary | ICD-10-CM | POA: Insufficient documentation

## 2018-07-29 DIAGNOSIS — Z87440 Personal history of urinary (tract) infections: Secondary | ICD-10-CM | POA: Insufficient documentation

## 2018-07-30 LAB — URINE CULTURE
MICRO NUMBER:: 3473
SPECIMEN QUALITY: ADEQUATE

## 2018-08-01 ENCOUNTER — Other Ambulatory Visit: Payer: Self-pay | Admitting: Physician Assistant

## 2018-08-01 DIAGNOSIS — N3000 Acute cystitis without hematuria: Secondary | ICD-10-CM

## 2018-08-01 MED ORDER — NITROFURANTOIN MONOHYD MACRO 100 MG PO CAPS: 100.0000 mg | ORAL_CAPSULE | Freq: Two times a day (BID) | ORAL | 0 refills | Status: AC

## 2018-08-19 ENCOUNTER — Other Ambulatory Visit: Payer: Self-pay | Admitting: Physician Assistant

## 2018-08-19 DIAGNOSIS — R1013 Epigastric pain: Secondary | ICD-10-CM

## 2018-12-07 DIAGNOSIS — H93293 Other abnormal auditory perceptions, bilateral: Secondary | ICD-10-CM | POA: Diagnosis not present

## 2019-04-04 ENCOUNTER — Telehealth: Payer: Self-pay

## 2019-04-04 ENCOUNTER — Encounter: Payer: Self-pay | Admitting: Family Medicine

## 2019-04-04 ENCOUNTER — Emergency Department (INDEPENDENT_AMBULATORY_CARE_PROVIDER_SITE_OTHER)
Admission: EM | Admit: 2019-04-04 | Discharge: 2019-04-04 | Disposition: A | Discharge status: Home / Self Care | Payer: BC Managed Care – PPO | Source: Home / Self Care

## 2019-04-04 ENCOUNTER — Other Ambulatory Visit: Payer: Self-pay

## 2019-04-04 ENCOUNTER — Emergency Department (INDEPENDENT_AMBULATORY_CARE_PROVIDER_SITE_OTHER): Payer: BC Managed Care – PPO

## 2019-04-04 DIAGNOSIS — K824 Cholesterolosis of gallbladder: Secondary | ICD-10-CM | POA: Diagnosis not present

## 2019-04-04 DIAGNOSIS — R1011 Right upper quadrant pain: Secondary | ICD-10-CM | POA: Diagnosis not present

## 2019-04-04 DIAGNOSIS — K810 Acute cholecystitis: Secondary | ICD-10-CM

## 2019-04-04 DIAGNOSIS — R1013 Epigastric pain: Secondary | ICD-10-CM

## 2019-04-04 MED ORDER — FAMOTIDINE 20 MG PO TABS: 20.0000 mg | ORAL_TABLET | Freq: Two times a day (BID) | ORAL | 0 refills | Status: DC

## 2019-04-04 NOTE — ED Provider Notes (Signed)
[facility]    CSN: [id number] Arrival date & time: 04/04/19  1414      History   Chief Complaint Chief Complaint  Patient presents with  . Abdominal Pain    HPI Cindy Dunlap is a 28 y.o. female.   28 yo woman presents to [facility] for an initial visit requesting evaluation for epigastric pain.  It began two days ago, intensified last night, and is better now.  She had this type of pain once before but it was milder and resolved spontaneously.  No fever, nausea, vomiting, diarrhea, or urinary symptoms.  Patient's grandmother had gall bladder surgery.  Took Tums which did not help.  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     History reviewed. No pertinent past medical history.  Patient Active Problem List   Diagnosis Date Noted  . Epigastric pain determined by examination 07/29/2018  . Nausea 07/29/2018  . History of urinary tract infection 07/29/2018  . Birth control 01/14/2016  . History of anemia 01/14/2016  . Family history of cardiac disorder 01/14/2016    History reviewed. No pertinent surgical history.  OB History   No obstetric history on file.      Home Medications    Prior to Admission medications   Medication Sig Start Date End Date Taking? Authorizing Provider  betamethasone dipropionate (DIPROLENE) 0.05 % cream Apply topically 2 (two) times daily. To affected area(s) as needed for 2-4 weeks 01/04/18   Sunnie Nielsen, DO  famotidine (PEPCID) 20 MG tablet Take 1 tablet (20 mg total) by mouth 2 (two) times daily. 04/04/19   Elvina Sidle, MD  fluticasone (FLONASE) 50 MCG/ACT nasal spray Place 2 sprays into both nostrils daily. 09/30/17   Sunnie Nielsen, DO  omeprazole (PRILOSEC) 40 MG capsule TAKE 1 CAPSULE BY MOUTH EVERY DAY 08/19/18   Sunnie Nielsen, DO    Family History Family History  Problem Relation Age of Onset  . Hyperlipidemia Father   . Hypertension Father   . Heart attack Father   . Diabetes Paternal Grandmother   . Stroke  Paternal Grandmother   . Heart attack Paternal Grandmother   . Cancer Paternal Grandfather        LIVER  . Heart attack Paternal Grandfather   . Colon cancer Maternal Uncle   . Lymphoma Maternal Grandmother   . Dementia Maternal Grandfather     Social History Social History   Tobacco Use  . Smoking status: Never Smoker  . Smokeless tobacco: Never Used  Substance Use Topics  . Alcohol use: Not on file  . Drug use: Not on file     Allergies   Ciprofloxacin   Review of Systems Review of Systems   Physical Exam Triage Vital Signs ED Triage Vitals  Enc Vitals Group     BP      Pulse      Resp      Temp      Temp src      SpO2      Weight      Height      Head Circumference      Peak Flow      Pain Score      Pain Loc      Pain Edu?      Excl. in GC?    No data found.  Updated Vital Signs BP (!) 135/94   Pulse 99   Temp 98.3 F (36.8 C)   Resp 20   Ht 5\' 4"  (1.626 m)  Wt 75.8 kg   LMP 03/28/2019   SpO2 96%   BMI 28.67 kg/m    Physical Exam Vitals signs and nursing note reviewed.  Constitutional:      General: She is not in acute distress.    Appearance: She is well-developed and normal weight. She is not ill-appearing or toxic-appearing.  HENT:     Head: Normocephalic.  Cardiovascular:     Heart sounds: Normal heart sounds.  Pulmonary:     Effort: Pulmonary effort is normal.     Breath sounds: Normal breath sounds.  Abdominal:     General: Abdomen is flat. Bowel sounds are normal. There is no distension.     Palpations: Abdomen is soft.     Tenderness: There is no abdominal tenderness.  Skin:    General: Skin is warm.  Neurological:     General: No focal deficit present.     Mental Status: She is alert and oriented to person, place, and time.  Psychiatric:        Mood and Affect: Mood normal.        Behavior: Behavior normal.      UC Treatments / Results  Labs (all labs ordered are listed, but only abnormal results are  displayed) Labs Reviewed - No data to display  EKG   Radiology US Abdomen Limited Ruq  Result Date: 04/04/2019 CLINICAL DATA:  Right upper quadrant pain EXAM: ULTRASOUND ABDOMEN LIMITED RIGHT UPPER QUADRANT COMPARISON:  None. FINDINGS: Gallbladder: Within the gallbladder, there is a 7 x 6 mm echogenic focus which neither moves nor shadows. A second echogenic focus which neither moves nor shadows measures 3 mm. There are no echogenic foci which move and shadow as is expected with gallstones. There is no gallbladder wall thickening or pericholecystic fluid. No sonographic Murphy sign noted by sonographer. Common bile duct: Diameter: 3 mm. No intrahepatic or extrahepatic biliary duct dilatation. Liver: No focal lesion identified. Within normal limits in parenchymal echogenicity. Portal vein is patent on color Doppler imaging with normal direction of blood flow towards the liver. Other: None. IMPRESSION: Nonmobile echogenic foci in the gallbladder, presumably polyps. Largest apparent polyp measures 7 mm. Per consensus guidelines, this finding warrants a follow-up ultrasound in 1 year to further assess. No appreciable gallstones, gallbladder wall thickening, or pericholecystic fluid. Study otherwise unremarkable. Electronically Signed   By: Lowella Grip III M.D.   On: 04/04/2019 16:08    Procedures Procedures (including critical care time)  Medications Ordered in UC Medications - No data to display  Initial Impression / Assessment and Plan / UC Course  I have reviewed the triage vital signs and the nursing notes.  Pertinent labs & imaging results that were available during my care of the patient were reviewed by me and considered in my medical decision making (see chart for details).     Final Clinical Impressions(s) / UC Diagnoses   Final diagnoses:  Abdominal pain, epigastric  Cholecystitis, acute     Discharge Instructions     With this abnormal ultrasound, the most likely cause  is gall bladder disease.    I called in some pepcid (famotidine) to help reduce chance of recurrence  Schedule an appointment with the surgeon     ED Prescriptions    Medication Sig Dispense Auth. Provider   famotidine (PEPCID) 20 MG tablet Take 1 tablet (20 mg total) by mouth 2 (two) times daily. 30 tablet Robyn Haber, MD     Controlled Substance Prescriptions [place] Controlled Substance Registry  consulted? Not Applicable   Elvina SidleLauenstein, Malyia Moro, MD 04/04/19 [phone]

## 2019-04-04 NOTE — ED Triage Notes (Signed)
Started Sunday.  Feels like there is a knot in upper abd.  Seems worse when eating.  Was doubled over with pain yesterday.

## 2019-04-04 NOTE — Discharge Instructions (Addendum)
With this abnormal ultrasound, the most likely cause is gall bladder disease.    I called in some pepcid (famotidine) to help reduce chance of recurrence  Schedule an appointment with the surgeon

## 2019-04-04 NOTE — Telephone Encounter (Signed)
Patient called stating she is having some stomach pains and discomfort under her ribs. States she is having some shortness of breath but just because stomach is hurting. Patient took some Tums but pain did not resolve.   Patient reports her husband was exposed to [place] and tomorrow is his 14th day of quarantine. Patient wanting to get rested but also be evaluated.   Advised patient to be seen at Urgent Care in our building, then can evaluate and test. Pt agreeable.   FYI to PCP

## 2019-04-21 DIAGNOSIS — K824 Cholesterolosis of gallbladder: Secondary | ICD-10-CM | POA: Diagnosis not present

## 2019-10-22 DIAGNOSIS — M79602 Pain in left arm: Secondary | ICD-10-CM | POA: Diagnosis not present

## 2019-10-22 DIAGNOSIS — S0990XA Unspecified injury of head, initial encounter: Secondary | ICD-10-CM | POA: Diagnosis not present

## 2019-10-22 DIAGNOSIS — M79605 Pain in left leg: Secondary | ICD-10-CM | POA: Diagnosis not present

## 2019-10-22 DIAGNOSIS — M25512 Pain in left shoulder: Secondary | ICD-10-CM | POA: Diagnosis not present

## 2019-10-22 DIAGNOSIS — Z043 Encounter for examination and observation following other accident: Secondary | ICD-10-CM | POA: Diagnosis not present

## 2019-10-22 DIAGNOSIS — Z888 Allergy status to other drugs, medicaments and biological substances status: Secondary | ICD-10-CM | POA: Diagnosis not present

## 2019-10-22 DIAGNOSIS — R519 Headache, unspecified: Secondary | ICD-10-CM | POA: Diagnosis not present

## 2019-10-25 ENCOUNTER — Telehealth: Payer: Self-pay

## 2019-10-25 NOTE — Telephone Encounter (Signed)
Would agree needs evaluation, keep appt w/ Minna Merritts
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
## 2019-10-26 ENCOUNTER — Encounter: Payer: Self-pay | Admitting: Nurse Practitioner

## 2019-10-26 ENCOUNTER — Telehealth (INDEPENDENT_AMBULATORY_CARE_PROVIDER_SITE_OTHER): Payer: BC Managed Care – PPO | Admitting: Nurse Practitioner

## 2019-10-26 DIAGNOSIS — F418 Other specified anxiety disorders: Secondary | ICD-10-CM
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
## 2019-10-30 ENCOUNTER — Telehealth: Payer: Self-pay

## 2019-10-30 NOTE — Telephone Encounter (Signed)
Pt has been updated regarding physical therapy referral. No other inquiries during call.
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
## 2019-10-30 NOTE — Telephone Encounter (Signed)
Referral for physical therapy for [facility] submitted.

## 2019-11-21 DIAGNOSIS — S060X0S Concussion without loss of consciousness, sequela: Secondary | ICD-10-CM | POA: Diagnosis not present

## 2019-11-23 ENCOUNTER — Ambulatory Visit (INDEPENDENT_AMBULATORY_CARE_PROVIDER_SITE_OTHER): Payer: BC Managed Care – PPO | Admitting: Nurse Practitioner

## 2019-11-23 ENCOUNTER — Ambulatory Visit (INDEPENDENT_AMBULATORY_CARE_PROVIDER_SITE_OTHER): Payer: BC Managed Care – PPO

## 2019-11-23 ENCOUNTER — Encounter: Payer: Self-pay | Admitting: Nurse Practitioner

## 2019-11-23 ENCOUNTER — Other Ambulatory Visit: Payer: Self-pay

## 2019-11-23 VITALS — BP 112/69 | HR 97 | Ht 64.0 in | Wt 138.0 lb

## 2019-11-23 DIAGNOSIS — R1011 Right upper quadrant pain: Secondary | ICD-10-CM

## 2019-11-23 DIAGNOSIS — R1012 Left upper quadrant pain: Secondary | ICD-10-CM | POA: Diagnosis not present

## 2019-11-23 DIAGNOSIS — R161 Splenomegaly, not elsewhere classified: Secondary | ICD-10-CM | POA: Diagnosis not present

## 2019-11-23 DIAGNOSIS — K824 Cholesterolosis of gallbladder: Secondary | ICD-10-CM | POA: Diagnosis not present

## 2019-11-23 MED ORDER — PANTOPRAZOLE SODIUM 40 MG PO TBEC: 40.0000 mg | DELAYED_RELEASE_TABLET | Freq: Every day | ORAL | 3 refills | Status: DC

## 2019-11-23 MED ORDER — FLUTICASONE PROPIONATE 50 MCG/ACT NA SUSP: 2.0000 | Freq: Every day | NASAL | 11 refills | Status: DC

## 2019-11-23 MED ORDER — ONDANSETRON HCL 8 MG PO TABS: 8.0000 mg | ORAL_TABLET | Freq: Three times a day (TID) | ORAL | 2 refills | Status: DC | PRN

## 2019-11-23 MED ORDER — FAMOTIDINE 20 MG PO TABS: 20.0000 mg | ORAL_TABLET | Freq: Two times a day (BID) | ORAL | 5 refills | Status: DC | PRN
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
## 2019-11-23 NOTE — Patient Instructions (Addendum)
Try taking the pantoprazole (Protonix) daily for at least 30 days to see if this helps with some of the discomfort in you upper abdomen.    Food Choices for Gastroesophageal Reflux Disease, Adult When you have gastroesophageal reflux disease (GERD), the foods you eat and your eating habits are very important. Choosing the right foods can help ease your discomfort. Think about working with a nutrition specialist (dietitian) to help you make good choices. What are tips for following this plan?  Meals  Choose healthy foods that are low in fat, such as fruits, vegetables, whole grains, low-fat dairy products, and lean meat, fish, and poultry.  Eat small meals often instead of 3 large meals a day. Eat your meals slowly, and in a place where you are relaxed. Avoid bending over or lying down until 2-3 hours after eating.  Avoid eating meals 2-3 hours before bed.  Avoid drinking a lot of liquid with meals.  Cook foods using methods other than frying. Bake, grill, or broil food instead.  Avoid or limit: ? Chocolate. ? Peppermint or spearmint. ? Alcohol. ? Pepper. ? Black and decaffeinated coffee. ? Black and decaffeinated tea. ? Bubbly (carbonated) soft drinks. ? Caffeinated energy drinks and soft drinks.  Limit high-fat foods such as: ? Fatty meat or fried foods. ? Whole milk, cream, butter, or ice cream. ? Nuts and nut butters. ? Pastries, donuts, and sweets made with butter or shortening.  Avoid foods that cause symptoms. These foods may be different for everyone. Common foods that cause symptoms include: ? Tomatoes. ? Oranges, lemons, and limes. ? Peppers. ? Spicy food. ? Onions and garlic. ? Vinegar. Lifestyle  Maintain a healthy weight. Ask your doctor what weight is healthy for you. If you need to lose weight, work with your doctor to do so safely.  Exercise for at least 30 minutes for 5 or more days each week, or as told by your doctor.  Wear loose-fitting  clothes.  Do not smoke. If you need help quitting, ask your doctor.  Sleep with the head of your bed higher than your feet. Use a wedge under the mattress or blocks under the bed frame to raise the head of the bed. Summary  When you have gastroesophageal reflux disease (GERD), food and lifestyle choices are very important in easing your symptoms.  Eat small meals often instead of 3 large meals a day. Eat your meals slowly, and in a place where you are relaxed.  Limit high-fat foods such as fatty meat or fried foods.  Avoid bending over or lying down until 2-3 hours after eating.  Avoid peppermint and spearmint, caffeine, alcohol, and chocolate. This information is not intended to replace advice given to you by your health care provider. Make sure you discuss any questions you have with your health care provider. Document Revised: 11/03/2018 Document Reviewed: 08/18/2016 Elsevier Patient Education  2020 [employer]   Cholelithiasis  Cholelithiasis is a form of gallbladder disease in which gallstones form in the gallbladder. The gallbladder is an organ that stores bile. Bile is made in the liver, and it helps to digest fats. Gallstones begin as small crystals and slowly grow into stones. They may cause no symptoms until the gallbladder tightens (contracts) and a gallstone is blocking the duct (gallbladder attack), which can cause pain. Cholelithiasis is also referred to as gallstones. There are two main types of gallstones:  Cholesterol stones. These are made of hardened cholesterol and are usually yellow-green in color. They are  the most common type of gallstone. Cholesterol is a white, waxy, fat-like substance that is made in the liver.  Pigment stones. These are dark in color and are made of a red-yellow substance that forms when hemoglobin from red blood cells breaks down (bilirubin). What are the causes? This condition may be caused by an imbalance in the substances that bile is  made of. This can happen if the bile:  Has too much bilirubin.  Has too much cholesterol.  Does not have enough bile salts. These salts help the body absorb and digest fats. In some cases, this condition can also be caused by the gallbladder not emptying completely or often enough. What increases the risk? The following factors may make you more likely to develop this condition:  Being female.  Having multiple pregnancies. Health care providers sometimes advise removing diseased gallbladders before future pregnancies.  Eating a diet that is heavy in fried foods, fat, and refined carbohydrates, like white bread and white rice.  Being obese.  Being older than age 32.  Prolonged use of medicines that contain female hormones (estrogen).  Having diabetes mellitus.  Rapidly losing weight.  Having a family history of gallstones.  Being of [place] or [place] descent.  Having an intestinal disease such as Crohn disease.  Having metabolic syndrome.  Having cirrhosis.  Having severe types of anemia such as sickle cell anemia. What are the signs or symptoms? In most cases, there are no symptoms. These are known as silent gallstones. If a gallstone blocks the bile ducts, it can cause a gallbladder attack. The main symptom of a gallbladder attack is sudden pain in the upper right abdomen. The pain usually comes at night or after eating a large meal. The pain can last for one or several hours and can spread to the right shoulder or chest. If the bile duct is blocked for more than a few hours, it can cause infection or inflammation of the gallbladder, liver, or pancreas, which may cause:  Nausea.  Vomiting.  Abdominal pain that lasts for 5 hours or more.  Fever or chills.  Yellowing of the skin or the whites of the eyes (jaundice).  Dark urine.  Light-colored stools. How is this diagnosed? This condition may be diagnosed based on:  A physical exam.  Your medical  history.  An ultrasound of your gallbladder.  CT scan.  MRI.  Blood tests to check for signs of infection or inflammation.  A scan of your gallbladder and bile ducts (biliary system) using nonharmful radioactive material and special cameras that can see the radioactive material (cholescintigram). This test checks to see how your gallbladder contracts and whether bile ducts are blocked.  Inserting a small tube with a camera on the end (endoscope) through your mouth to inspect bile ducts and check for blockages (endoscopic retrograde cholangiopancreatogram). How is this treated? Treatment for gallstones depends on the severity of the condition. Silent gallstones do not need treatment. If the gallstones cause a gallbladder attack or other symptoms, treatment may be required. Options for treatment include:  Surgery to remove the gallbladder (cholecystectomy). This is the most common treatment.  Medicines to dissolve gallstones. These are most effective at treating small gallstones. You may need to take medicines for up to 6-12 months.  Shock wave treatment (extracorporeal biliary lithotripsy). In this treatment, an ultrasound machine sends shock waves to the gallbladder to break gallstones into smaller pieces. These pieces can then be passed into the intestines or be dissolved by  medicine. This is rarely used.  Removing gallstones through endoscopic retrograde cholangiopancreatogram. A small basket can be attached to the endoscope and used to capture and remove gallstones. Follow these instructions at home:  Take over-the-counter and prescription medicines only as told by your health care provider.  Maintain a healthy weight and follow a healthy diet. This includes: ? Reducing fatty foods, such as fried food. ? Reducing refined carbohydrates, like white bread and white rice. ? Increasing fiber. Aim for foods like almonds, fruit, and beans.  Keep all follow-up visits as told by your health  care provider. This is important. Contact a health care provider if:  You think you have had a gallbladder attack.  You have been diagnosed with silent gallstones and you develop abdominal pain or indigestion. Get help right away if:  You have pain from a gallbladder attack that lasts for more than 2 hours.  You have abdominal pain that lasts for more than 5 hours.  You have a fever or chills.  You have persistent nausea and vomiting.  You develop jaundice.  You have dark urine or light-colored stools. Summary  Cholelithiasis (also called gallstones) is a form of gallbladder disease in which gallstones form in the gallbladder.  This condition is caused by an imbalance in the substances that make up bile. This can happen if the bile has too much cholesterol, too much bilirubin, or not enough bile salts.  You are more likely to develop this condition if you are female, pregnant, using medicines with estrogen, obese, older than age 21, or have a family history of gallstones. You may also develop gallstones if you have diabetes, an intestinal disease, cirrhosis, or metabolic syndrome.  Treatment for gallstones depends on the severity of the condition. Silent gallstones do not need treatment.  If gallstones cause a gallbladder attack or other symptoms, treatment may be needed. The most common treatment is surgery to remove the gallbladder. This information is not intended to replace advice given to you by your health care provider. Make sure you discuss any questions you have with your health care provider. Document Revised: 06/25/2017 Document Reviewed: 03/29/2016 Elsevier Patient Education  2020 [employer].   Pancreatitis Eating Plan Pancreatitis is when your pancreas becomes irritated and swollen (inflamed). The pancreas is a small organ located behind your stomach. It helps your body digest food and regulate your blood sugar. Pancreatitis can affect how your body digests food,  especially foods with fat. You may also have other symptoms such as abdominal pain or nausea. When you have pancreatitis, following a low-fat eating plan may help you manage symptoms and recover more quickly. Work with your health care provider or a diet and nutrition specialist (dietitian) to create an eating plan that is right for you. What are tips for following this plan? Reading food labels Use the information on food labels to help keep track of how much fat you eat:  Check the serving size.  Look for the amount of total fat in grams (g) in one serving. ? Low-fat foods have 3 g of fat or less per serving. ? Fat-free foods have 0.5 g of fat or less per serving.  Keep track of how much fat you eat based on how many servings you eat. ? For example, if you eat two servings, the amount of fat you eat will be two times what is listed on the label. Shopping   Buy low-fat or nonfat foods, such as: ? Fresh, frozen, or canned fruits  and vegetables. ? Grains, including pasta, bread, and rice. ? Lean meat, poultry, fish, and other protein foods. ? Low-fat or nonfat dairy.  Avoid buying bakery products and other sweets made with whole milk, butter, and eggs.  Avoid buying snack foods with added fat, such as anything with butter or cheese flavoring. Cooking  Remove skin from poultry, and remove extra fat from meat.  Limit the amount of fat and oil you use to 6 teaspoons or less per day.  Cook using low-fat methods, such as boiling, broiling, grilling, steaming, or baking.  Use spray oil to cook. Add fat-free chicken broth to add flavor and moisture.  Avoid adding cream to thicken soups or sauces. Use other thickeners such as corn starch or tomato paste. Meal planning   Eat a low-fat diet as told by your dietitian. For most people, this means having no more than 55-65 grams of fat each day.  Eat small, frequent meals throughout the day. For example, you may have 5-6 small meals  instead of 3 large meals.  Drink enough fluid to keep your urine pale yellow.  Do not drink alcohol. Talk to your health care provider if you need help stopping.  Limit how much caffeine you have, including black coffee, black and green tea, caffeinated soft drinks, and energy drinks. General information  Let your health care provider or dietitian know if you have unplanned weight loss on this eating plan.  You may be instructed to follow a clear liquid diet during a flare of symptoms. Talk with your health care provider about how to manage your diet during symptoms of a flare.  Take any vitamins or supplements as told by your health care provider.  Work with a Data processing manager, especially if you have other conditions such as obesity or diabetes mellitus. What foods should I avoid? Fruits Fried fruits. Fruits served with butter or cream. Vegetables Fried vegetables. Vegetables cooked with butter, cheese, or cream. Grains Biscuits, waffles, donuts, pastries, and croissants. Pies and cookies. Butter-flavored popcorn. Regular crackers. Meats and other protein foods Fatty cuts of meat. Poultry with skin. Organ meats. Bacon, sausage, and cold cuts. Whole eggs. Nuts and nut butters. Dairy Whole and 2% milk. Whole milk yogurt. Whole milk ice cream. Cream and half-and-half. Cream cheese. Sour cream. Cheese. Beverages Wine, beer, and liquor. The items listed above may not be a complete list of foods and beverages to avoid. Contact a dietitian for more information. Summary  Pancreatitis can affect how your body digests food, especially foods with fat.  When you have pancreatitis, it is recommended that you follow a low-fat eating plan to help you recover more quickly and manage symptoms. For most people, this means limiting fat to no more than 55-65 grams per day.  Do not drink alcohol. Limit the amount of caffeine you have, and drink enough fluid to keep your urine pale yellow. This information  is not intended to replace advice given to you by your health care provider. Make sure you discuss any questions you have with your health care provider. Document Revised: 11/03/2018 Document Reviewed: 10/19/2017 Elsevier Patient Education  2020 [employer].   Acute Pancreatitis  The pancreas is a gland that is located behind the stomach on the left side of the abdomen. It produces enzymes that help to digest food. The pancreas also releases the hormones glucagon and insulin, which help to regulate blood sugar. Acute pancreatitis happens when inflammation of the pancreas suddenly occurs and the pancreas becomes irritated and  swollen. Most acute attacks last a few days and cause serious problems. Some people become dehydrated and develop low blood pressure. In severe cases, bleeding in the abdomen can lead to shock and can be life-threatening. The lungs, heart, and kidneys may fail. What are the causes? This condition may be caused by:  Alcohol abuse.  Drug abuse.  Gallstones or other conditions that can block the tube that drains the pancreas (pancreatic duct).  A tumor in the pancreas. Other causes include:  Certain medicines.  Exposure to certain chemicals.  Diabetes.  An infection in the pancreas.  Damage caused by an accident (trauma).  The poison (venom) from a scorpion bite.  Abdominal surgery.  Autoimmune pancreatitis. This is when the body's disease-fighting (immune) system attacks the pancreas.  Genes that are passed from parent to child (inherited). In some cases, the cause of this condition is not known. What are the signs or symptoms? Symptoms of this condition include:  Pain in the upper abdomen that may radiate to the back. Pain may be severe.  Tenderness and swelling of the abdomen.  Nausea and vomiting.  Fever. How is this diagnosed? This condition may be diagnosed based on:  A physical exam.  Blood tests.  Imaging tests, such as X-rays, CT or  MRI scans, or an ultrasound of the abdomen. How is this treated? Treatment for this condition usually requires a stay in the hospital. Treatment for this condition may include:  Pain medicine.  Fluid replacement through an IV.  Placing a tube in the stomach to remove stomach contents and to control vomiting (NG tube, or nasogastric tube).  Not eating for 3-4 days. This gives the pancreas a rest, because enzymes are not being produced that can cause further damage.  Antibiotic medicines, if your condition is caused by an infection.  Treating any underlying conditions that may be the cause.  Steroid medicines, if your condition is caused by your immune system attacking your body's own tissues (autoimmune disease).  Surgery on the pancreas or gallbladder. Follow these instructions at home: Eating and drinking   Follow instructions from your health care provider about diet. This may involve avoiding alcohol and decreasing the amount of fat in your diet.  Eat smaller, more frequent meals. This reduces the amount of digestive fluids that the pancreas produces.  Drink enough fluid to keep your urine pale yellow.  Do not drink alcohol if it caused your condition. General instructions  Take over-the-counter and prescription medicines only as told by your health care provider.  Do not drive or use heavy machinery while taking prescription pain medicine.  Ask your health care provider if the medicine prescribed to you can cause constipation. You may need to take steps to prevent or treat constipation, such as: ? Take an over-the-counter or prescription medicine for constipation. ? Eat foods that are high in fiber such as whole grains and beans. ? Limit foods that are high in fat and processed sugars, such as fried or sweet foods.  Do not use any products that contain nicotine or tobacco, such as cigarettes, e-cigarettes, and chewing tobacco. If you need help quitting, ask your health  care provider.  Get plenty of rest.  If directed, check your blood sugar at home as told by your health care provider.  Keep all follow-up visits as told by your health care provider. This is important. Contact a health care provider if you:  Do not recover as quickly as expected.  Develop new or worsening  symptoms.  Have persistent pain, weakness, or nausea.  Recover and then have another episode of pain.  Have a fever. Get help right away if:  You cannot eat or keep fluids down.  Your pain becomes severe.  Your skin or the white part of your eyes turns yellow (jaundice).  You have sudden swelling in your abdomen.  You vomit.  You feel dizzy or you faint.  Your blood sugar is high (over 300 mg/dL). Summary  Acute pancreatitis happens when inflammation of the pancreas suddenly occurs and the pancreas becomes irritated and swollen.  This condition is typically caused by alcohol abuse, drug abuse, or gallstones.  Treatment for this condition usually requires a stay in the hospital. This information is not intended to replace advice given to you by your health care provider. Make sure you discuss any questions you have with your health care provider. Document Revised: 05/02/2018 Document Reviewed: 01/17/2018 Elsevier Patient Education  2020 [employer].

## 2019-11-24 ENCOUNTER — Other Ambulatory Visit: Payer: Self-pay

## 2019-11-24 ENCOUNTER — Encounter: Payer: Self-pay | Admitting: Gastroenterology

## 2019-11-27 ENCOUNTER — Telehealth: Payer: Self-pay

## 2019-11-27 DIAGNOSIS — D72819 Decreased white blood cell count, unspecified: Secondary | ICD-10-CM

## 2019-11-27 LAB — COMPLETE METABOLIC PANEL WITH GFR
AG Ratio: 2 (calc) (ref 1.0–2.5)
ALT: 6 U/L (ref 6–29)
AST: 12 U/L (ref 10–30)
Albumin: 4.7 g/dL (ref 3.6–5.1)
Alkaline phosphatase (APISO): 52 U/L (ref 31–125)
BUN: 10 mg/dL (ref 7–25)
CO2: 26 mmol/L (ref 20–32)
Calcium: 9.4 mg/dL (ref 8.6–10.2)
Chloride: 105 mmol/L (ref 98–110)
Creat: 0.82 mg/dL (ref 0.50–1.10)
GFR, Est African American: 113 mL/min/{1.73_m2} (ref >=60)
GFR, Est Non African American: 97 mL/min/{1.73_m2} (ref >=60)
Globulin: 2.3 g/dL (calc) (ref 1.9–3.7)
Glucose, Bld: 96 mg/dL (ref 65–99)
Potassium: 4 mmol/L (ref 3.5–5.3)
Sodium: 140 mmol/L (ref 135–146)
Total Bilirubin: 0.7 mg/dL (ref 0.2–1.2)
Total Protein: 7 g/dL (ref 6.1–8.1)

## 2019-11-27 LAB — AMYLASE: Amylase: 33 U/L (ref 21–101)

## 2019-11-27 LAB — CBC WITH DIFFERENTIAL/PLATELET
Absolute Monocytes: 387 cells/uL (ref 200–950)
Basophils Absolute: 10 cells/uL (ref 0–200)
Basophils Relative: 0.3 %
Eosinophils Absolute: 10 cells/uL — ABNORMAL LOW (ref 15–500)
Eosinophils Relative: 0.3 %
HCT: 40.5 % (ref 35.0–45.0)
Hemoglobin: 13.3 g/dL (ref 11.7–15.5)
Lymphs Abs: 1059 cells/uL (ref 850–3900)
MCH: 29.9 pg (ref 27.0–33.0)
MCHC: 32.8 g/dL (ref 32.0–36.0)
MCV: 91 fL (ref 80.0–100.0)
MPV: 11.8 fL (ref 7.5–12.5)
Monocytes Relative: 12.1 %
Neutro Abs: 1734 cells/uL (ref 1500–7800)
Neutrophils Relative %: 54.2 %
Platelets: 173 10*3/uL (ref 140–400)
RBC: 4.45 10*6/uL (ref 3.80–5.10)
RDW: 11.9 % (ref 11.0–15.0)
Total Lymphocyte: 33.1 %
WBC: 3.2 10*3/uL — ABNORMAL LOW (ref 3.8–10.8)

## 2019-11-27 LAB — LIPASE: Lipase: 19 U/L (ref 7–60)
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
## 2019-11-28 ENCOUNTER — Telehealth: Payer: Self-pay

## 2019-11-28 NOTE — Telephone Encounter (Signed)
Pt aware of Huntley Dec Beth's response. Pt would like to have the lab orders entered to have the WBC count rechecked in a couple of weeks. Pt states that she did stop taking the vitamin C and started the pantoprazole. She took the pantoprazole Friday night and it started to wear off mid-afternoon Saturday, so she thought it would be better to take it in the mornings. She started taking it Sunday morning and states that there has been improvement in her symptoms.   Orders for a CBC has been tee'd up below and is ready for review regarding dx code and approval.
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
## 2019-11-29 NOTE — Telephone Encounter (Signed)
Patient aware of information from Les Pou and the recommendations regarding repeat labs. No further questions or concerns at this time.

## 2019-12-29 ENCOUNTER — Ambulatory Visit (INDEPENDENT_AMBULATORY_CARE_PROVIDER_SITE_OTHER): Payer: BC Managed Care – PPO | Admitting: Gastroenterology

## 2019-12-29 ENCOUNTER — Encounter: Payer: Self-pay | Admitting: Gastroenterology

## 2019-12-29 VITALS — BP 110/74 | HR 97 | Ht 66.0 in | Wt 136.5 lb

## 2019-12-29 DIAGNOSIS — D72819 Decreased white blood cell count, unspecified: Secondary | ICD-10-CM | POA: Diagnosis not present

## 2019-12-29 DIAGNOSIS — R161 Splenomegaly, not elsewhere classified: Secondary | ICD-10-CM

## 2019-12-29 DIAGNOSIS — R634 Abnormal weight loss: Secondary | ICD-10-CM | POA: Diagnosis not present

## 2019-12-29 DIAGNOSIS — R1013 Epigastric pain: Secondary | ICD-10-CM

## 2019-12-29 DIAGNOSIS — G8929 Other chronic pain: Secondary | ICD-10-CM

## 2019-12-29 DIAGNOSIS — Z01818 Encounter for other preprocedural examination: Secondary | ICD-10-CM

## 2019-12-29 LAB — CBC WITH DIFFERENTIAL/PLATELET
Absolute Monocytes: 422 cells/uL (ref 200–950)
Basophils Absolute: 21 cells/uL (ref 0–200)
Basophils Relative: 0.5 %
Eosinophils Absolute: 21 cells/uL (ref 15–500)
Eosinophils Relative: 0.5 %
HCT: 39.5 % (ref 35.0–45.0)
Hemoglobin: 12.8 g/dL (ref 11.7–15.5)
Lymphs Abs: 1144 cells/uL (ref 850–3900)
MCH: 29.7 pg (ref 27.0–33.0)
MCHC: 32.4 g/dL (ref 32.0–36.0)
MCV: 91.6 fL (ref 80.0–100.0)
MPV: 12.1 fL (ref 7.5–12.5)
Monocytes Relative: 10.3 %
Neutro Abs: 2493 cells/uL (ref 1500–7800)
Neutrophils Relative %: 60.8 %
Platelets: 194 10*3/uL (ref 140–400)
RBC: 4.31 10*6/uL (ref 3.80–5.10)
RDW: 11.9 % (ref 11.0–15.0)
Total Lymphocyte: 27.9 %
WBC: 4.1 10*3/uL (ref 3.8–10.8)
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
## 2019-12-29 NOTE — Patient Instructions (Signed)
It was a pleasure to meet you today.  I recommend that you take the omeprazole 40 mg twice daily, ideally 30-60 minutes prior to meals.  Let's plan an upper endoscopy to evaluate your esophagus, stomach, and small intestines. This will allow Cindy Dunlap to look for infection, inflammation, ulcer, celiac disease, and even cancer.   I would like for your to return gluten to your diet for at least 2 weeks before the endoscopy.  Please call with any questions or concerns prior to that time.

## 2020-01-01 LAB — EPSTEIN-BARR VIRUS VCA ANTIBODY PANEL
EBV NA IgG: 228 U/mL — ABNORMAL HIGH
EBV VCA IgG: 125 U/mL — ABNORMAL HIGH
EBV VCA IgM: <36 U/mL

## 2020-01-11 ENCOUNTER — Ambulatory Visit (INDEPENDENT_AMBULATORY_CARE_PROVIDER_SITE_OTHER): Payer: BC Managed Care – PPO

## 2020-01-11 DIAGNOSIS — Z1159 Encounter for screening for other viral diseases: Secondary | ICD-10-CM

## 2020-01-12 ENCOUNTER — Telehealth: Payer: Self-pay

## 2020-01-12 ENCOUNTER — Other Ambulatory Visit: Payer: Self-pay | Admitting: Gastroenterology

## 2020-01-12 ENCOUNTER — Ambulatory Visit (INDEPENDENT_AMBULATORY_CARE_PROVIDER_SITE_OTHER): Payer: BC Managed Care – PPO

## 2020-01-12 DIAGNOSIS — Z1159 Encounter for screening for other viral diseases: Secondary | ICD-10-CM

## 2020-01-12 NOTE — Telephone Encounter (Signed)
Pt is scheduled to see Dr. Orvan Falconer on 01/15/20 at 10:00am. Pt no showed her covid test scheduled on 01/11/20. Tried to call patient to verify if she had the covid vaccine or if she went to a different testing location. I was unable to reach the patient. She has a Geophysicist/field seismologist that is full, and I was unable to leave a message. Will try to reach patient again this afternoon.

## 2020-01-12 NOTE — Telephone Encounter (Signed)
Thank you for your efforts. Hopefully you will be able to reach her later today.

## 2020-01-12 NOTE — Telephone Encounter (Signed)
Patient returned call. She thought her covid test was scheduled for today 01/12/20. Pt instructed that she needed to have test today prior to her procedure, and I added her to the schedule to have a covid test done today at 10:15am. Pt verbalized understanding, and she is heading to testing site now.

## 2020-01-13 LAB — SARS CORONAVIRUS 2 (TAT 6-24 HRS): SARS Coronavirus 2: NEGATIVE

## 2020-01-15 ENCOUNTER — Ambulatory Visit (AMBULATORY_SURGERY_CENTER): Payer: BC Managed Care – PPO | Admitting: Gastroenterology

## 2020-01-15 ENCOUNTER — Other Ambulatory Visit: Payer: Self-pay

## 2020-01-15 ENCOUNTER — Encounter: Payer: Self-pay | Admitting: Gastroenterology

## 2020-01-15 VITALS — BP 97/61 | HR 57 | Temp 97.3°F | Resp 10 | Ht 66.0 in | Wt 136.0 lb

## 2020-01-15 DIAGNOSIS — G8929 Other chronic pain: Secondary | ICD-10-CM

## 2020-01-15 DIAGNOSIS — K2980 Duodenitis without bleeding: Secondary | ICD-10-CM | POA: Diagnosis not present

## 2020-01-15 DIAGNOSIS — K200 Eosinophilic esophagitis: Secondary | ICD-10-CM

## 2020-01-15 DIAGNOSIS — K228 Other specified diseases of esophagus: Secondary | ICD-10-CM | POA: Diagnosis not present

## 2020-01-15 DIAGNOSIS — K3189 Other diseases of stomach and duodenum: Secondary | ICD-10-CM

## 2020-01-15 DIAGNOSIS — R109 Unspecified abdominal pain: Secondary | ICD-10-CM | POA: Diagnosis not present

## 2020-01-15 MED ORDER — SODIUM CHLORIDE 0.9 % IV SOLN: 500.0000 mL | Freq: Once | INTRAVENOUS | Status: DC

## 2020-01-15 NOTE — Op Note (Signed)
[facility] Patient Name: Cindy Dunlap Procedure Date: 01/15/2020 10:07 AM MRN: [id number] Endoscopist: Tressia Danas MD, MD Age: 29 Referring MD:  Date of Birth: 1991-07-11 Gender: Female Account #: [id number] Procedure:                Upper GI endoscopy Indications:              Epigastric abdominal pain Medicines:                Monitored Anesthesia Care Procedure:                Pre-Anesthesia Assessment:                           - Prior to the procedure, a History and Physical                            was performed, and patient medications and                            allergies were reviewed. The patient's tolerance of                            previous anesthesia was also reviewed. The risks                            and benefits of the procedure and the sedation                            options and risks were discussed with the patient.                            All questions were answered, and informed consent                            was obtained. Prior Anticoagulants: The patient has                            taken no previous anticoagulant or antiplatelet                            agents. ASA Grade Assessment: I - A normal, healthy                            patient. After reviewing the risks and benefits,                            the patient was deemed in satisfactory condition to                            undergo the procedure.                           After obtaining informed consent, the endoscope was  passed under direct vision. Throughout the                            procedure, the patient's blood pressure, pulse, and                            oxygen saturations were monitored continuously. The                            Endoscope was introduced through the mouth, and                            advanced to the third part of duodenum. The upper                            GI endoscopy was accomplished without  difficulty.                            The patient tolerated the procedure well. Scope In: Scope Out: Findings:                 The examined esophagus was normal. Biopsies were                            obtained from the proximal and distal esophagus                            with cold forceps for histology.                           The entire examined stomach was normal. Biopsies                            were taken from the antrum, body, and fundus with a                            cold forceps for histology. Estimated blood loss                            was minimal.                           The examined duodenum was normal. Biopsies were                            taken with a cold forceps for histology. Estimated                            blood loss was minimal. Complications:            No immediate complications. Estimated blood loss:                            Minimal. Estimated Blood Loss:     Estimated blood loss was minimal. Impression:               -  Normal esophagus. Biopsied.                           - Normal stomach. Biopsied.                           - Normal examined duodenum. Biopsied. Recommendation:           - Patient has a contact number available for                            emergencies. The signs and symptoms of potential                            delayed complications were discussed with the                            patient. Return to normal activities tomorrow.                            Written discharge instructions were provided to the                            patient.                           - Resume previous diet.                           - Continue present medications.                           - Await pathology results.                           - Proceed with HIDA scan to evaluate for                            symptomatic gallbladder disease if the biopsies are                            negative. Thornton Park MD,  MD 01/15/2020 10:34:11 AM This report has been signed electronically.

## 2020-01-15 NOTE — Patient Instructions (Signed)
YOU HAD AN ENDOSCOPIC PROCEDURE TODAY AT THE [facility]:   Refer to the procedure report that was given to you for any specific questions about what was found during the examination.  If the procedure report does not answer your questions, please call your gastroenterologist to clarify.  If you requested that your care partner not be given the details of your procedure findings, then the procedure report has been included in a sealed envelope for you to review at your convenience later.  YOU SHOULD EXPECT: Some feelings of bloating in the abdomen. Passage of more gas than usual.  Walking can help get rid of the air that was put into your GI tract during the procedure and reduce the bloating. If you had a lower endoscopy (such as a colonoscopy or flexible sigmoidoscopy) you may notice spotting of blood in your stool or on the toilet paper. If you underwent a bowel prep for your procedure, you may not have a normal bowel movement for a few days.  Please Note:  You might notice some irritation and congestion in your nose or some drainage.  This is from the oxygen used during your procedure.  There is no need for concern and it should clear up in a day or so.  SYMPTOMS TO REPORT IMMEDIATELY:    Following upper endoscopy (EGD)  Vomiting of blood or coffee ground material  New chest pain or pain under the shoulder blades  Painful or persistently difficult swallowing  New shortness of breath  Fever of 100F or higher  Black, tarry-looking stools  For urgent or emergent issues, a gastroenterologist can be reached at any hour by calling [phone]. Do not use MyChart messaging for urgent concerns.    DIET:  We do recommend a small meal at first, but then you may proceed to your regular diet.  Drink plenty of fluids but you should avoid alcoholic beverages for 24 hours.  ACTIVITY:  You should plan to take it easy for the rest of today and you should NOT DRIVE or use heavy machinery  until tomorrow (because of the sedation medicines used during the test).    FOLLOW UP: Our staff will call the number listed on your records 48-72 hours following your procedure to check on you and address any questions or concerns that you may have regarding the information given to you following your procedure. If we do not reach you, we will leave a message.  We will attempt to reach you two times.  During this call, we will ask if you have developed any symptoms of COVID 19. If you develop any symptoms (ie: fever, flu-like symptoms, shortness of breath, cough etc.) before then, please call [phone].  If you test positive for Covid 19 in the 2 weeks post procedure, please call and report this information to us.    If any biopsies were taken you will be contacted by phone or by letter within the next 1-3 weeks.  Please call us at [phone] if you have not heard about the biopsies in 3 weeks.    SIGNATURES/CONFIDENTIALITY: You and/or your care partner have signed paperwork which will be entered into your electronic medical record.  These signatures attest to the fact that that the information above on your After Visit Summary has been reviewed and is understood.  Full responsibility of the confidentiality of this discharge information lies with you and/or your care-partner. 

## 2020-01-15 NOTE — Progress Notes (Signed)
Called to room to assist during endoscopic procedure.  Patient ID and intended procedure confirmed with present staff. Received instructions for my participation in the procedure from the performing physician.  

## 2020-01-15 NOTE — Progress Notes (Signed)
VS by CW  Pt's states no medical or surgical changes since previsit or office visit.  

## 2020-01-15 NOTE — Progress Notes (Signed)
pt tolerated well. VSS. awake and to recovery. Report given to RN. Bite block placed and removed wihtout trauma.

## 2020-01-16 ENCOUNTER — Other Ambulatory Visit: Payer: Self-pay

## 2020-01-16 ENCOUNTER — Telehealth: Payer: Self-pay

## 2020-01-16 DIAGNOSIS — G8929 Other chronic pain: Secondary | ICD-10-CM

## 2020-01-16 NOTE — Telephone Encounter (Signed)
-----   Message from Tressia Danas, MD sent at 01/15/2020 10:29 AM EDT ----- Please schedule HIDA with [facility] to evaluate abdominal pain.  Thank you!  KLB

## 2020-01-16 NOTE — Telephone Encounter (Signed)
Pt scheduled for HIDA scan at [facility] 02/01/20@10 :30am, pt to arrive there at 10:15am and be NPO after midnight. Pt aware of appt.

## 2020-01-17 ENCOUNTER — Telehealth: Payer: Self-pay | Admitting: *Deleted

## 2020-01-17 ENCOUNTER — Telehealth: Payer: Self-pay

## 2020-01-17 NOTE — Telephone Encounter (Signed)
Follow up call made, mailbox was full. 

## 2020-01-17 NOTE — Telephone Encounter (Signed)
  Follow up Call-  Call back number 01/15/2020  Post procedure Call Back phone  # [phone]  Permission to leave phone message No  Some recent data might be hidden     Patient questions:  Do you have a fever, pain , or abdominal swelling? No. Pain Score  0 *  Have you tolerated food without any problems? Yes.    Have you been able to return to your normal activities? Yes.    Do you have any questions about your discharge instructions: Diet   No. Medications  No. Follow up visit  No.  Do you have questions or concerns about your Care? No.  Actions: * If pain score is 4 or above: No action needed, pain <4.  1. Have you developed a fever since your procedure? no  2.   Have you had an respiratory symptoms (SOB or cough) since your procedure? no  3.   Have you tested positive for COVID 19 since your procedure no  4.   Have you had any family members/close contacts diagnosed with the COVID 19 since your procedure?  no   If yes to any of these questions please route to Laverna Peace, RN and Charlett Lango, RN

## 2020-02-01 ENCOUNTER — Ambulatory Visit (HOSPITAL_COMMUNITY): Payer: BC Managed Care – PPO

## 2020-02-26 ENCOUNTER — Other Ambulatory Visit: Payer: Self-pay

## 2020-02-26 ENCOUNTER — Ambulatory Visit (INDEPENDENT_AMBULATORY_CARE_PROVIDER_SITE_OTHER): Payer: BC Managed Care – PPO | Admitting: Nurse Practitioner

## 2020-02-26 ENCOUNTER — Encounter (HOSPITAL_COMMUNITY)
Admission: RE | Admit: 2020-02-26 | Discharge: 2020-02-26 | Disposition: A | Discharge status: Home / Self Care | Payer: BC Managed Care – PPO | Source: Ambulatory Visit | Attending: Gastroenterology | Admitting: Gastroenterology

## 2020-02-26 ENCOUNTER — Encounter: Payer: Self-pay | Admitting: Nurse Practitioner

## 2020-02-26 VITALS — BP 103/72 | HR 96 | Temp 98.7°F | Ht 66.0 in | Wt 134.1 lb

## 2020-02-26 DIAGNOSIS — R0602 Shortness of breath: Secondary | ICD-10-CM | POA: Diagnosis not present

## 2020-02-26 DIAGNOSIS — R634 Abnormal weight loss: Secondary | ICD-10-CM | POA: Diagnosis not present

## 2020-02-26 DIAGNOSIS — R002 Palpitations: Secondary | ICD-10-CM | POA: Diagnosis not present

## 2020-02-26 DIAGNOSIS — G8929 Other chronic pain: Secondary | ICD-10-CM | POA: Insufficient documentation

## 2020-02-26 DIAGNOSIS — R1011 Right upper quadrant pain: Secondary | ICD-10-CM | POA: Diagnosis not present

## 2020-02-26 DIAGNOSIS — F419 Anxiety disorder, unspecified: Secondary | ICD-10-CM

## 2020-02-26 DIAGNOSIS — R1013 Epigastric pain: Secondary | ICD-10-CM | POA: Diagnosis not present

## 2020-02-26 LAB — POCT GLYCOSYLATED HEMOGLOBIN (HGB A1C): Hemoglobin A1C: 4.7 % (ref 4.0–5.6)

## 2020-02-26 LAB — POCT HEMOGLOBIN: Hemoglobin: 12.9 g/dL (ref 11–14.6)

## 2020-02-26 MED ORDER — SERTRALINE HCL 25 MG PO TABS: 25.0000 mg | ORAL_TABLET | Freq: Every day | ORAL | 2 refills | Status: DC

## 2020-02-26 MED ORDER — TECHNETIUM TC 99M MEBROFENIN IV KIT: 4.5000 | PACK | Freq: Once | INTRAVENOUS | Status: DC | PRN

## 2020-02-26 NOTE — Progress Notes (Signed)
Acute Office Visit  Subjective:    Patient ID: Cindy Dunlap, female    DOB: 1990-11-25, 29 y.o.   MRN: [id number]  Chief Complaint  Patient presents with  . Shortness of Breath    onset:6d; can't take a deep breath all the time, feels like she needs to take more breaths, has been monitoring her O2 at home and has been in normal range, chest and lung heaviness, feels like she has been having some palpitations    HPI Patient is in today for chest heaviness and palpitations that started about 6 days ago. She reports that she feels like she cannot catch a deep breath, but her home O2 levels have been within the normal range. She reports her HR is typically in the 60's-70's at home, but occasionally with palpitations they read in the upper 90's. She reports the highest is 107.  She reports that she developed a sore throat about three weeks ago and has since had mild fatigue. She is unsure if this could be related to the chest pressure she is experiencing now.   She does endorse anxiety and reports that she is unsure if her symptoms could be related to this. GAD7 is 1, PHQ9 is 2 today. 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 She does endorse anxiety and stress over relationships with extended family.   Of note: She did experience a near syncopal episode prior to a Hida Scan this morning after insertion of the IV where she exhibited pallor and diaphoresis. Vital signs were reportedly normal after a period of rest in reverse trendelenburg position. The scan was performed as normal.   She has also lost a significant amount of weight (over 30 lbs) since the beginning of the year and she is unsure if this is related to the gastrointestinal symptoms she has been experiencing or something else. She would like to be tested for diabetes, as this is a concern of hers since her grandmother was a diabetic.    She is having ongoing upper GI pain, but has stopped taking her GI medications because she feels that she can control her symptoms by watching the food that she eats. She tried two different PPI's but reports they both caused significant bruising on her legs and she felt that it was best to stop the medication if she could control the symptoms.   Past Medical History:  Diagnosis Date  . GERD (gastroesophageal reflux disease)     Past Surgical History:  Procedure Laterality Date  . DILATION AND CURETTAGE OF UTERUS  2015    Family History  Problem Relation Age of Onset  . Hyperlipidemia Father   . Hypertension Father   . Heart attack Father   . Diabetes Paternal Grandmother   . Stroke Paternal Grandmother   . Heart attack Paternal Grandmother   . Cancer Paternal Grandfather        LIVER  . Heart attack Paternal Grandfather   . Colon cancer Maternal Uncle   . Lymphoma Maternal Grandmother   . Dementia Maternal Grandfather   . Stomach cancer Paternal Uncle   . Esophageal cancer Paternal Uncle   . Pancreatic cancer Neg Hx   . Rectal cancer Neg Hx     Social History   Socioeconomic History  . Marital status: Married    Spouse name: Not on file  . Number  of children: Not on file  . Years of education: Not on file  . Highest education level: Not on file  Occupational History  . Not on file  Tobacco Use  . Smoking status: Never Smoker  . Smokeless tobacco: Never Used  Vaping Use  . Vaping Use: Never used  Substance and Sexual Activity  . Alcohol use: Never    Alcohol/week: 0.0 standard drinks  . Drug use: Never  . Sexual activity: Not on file  Other Topics Concern  . Not on file  Social History Narrative  . Not on file   Social Determinants of Health   Financial Resource Strain:   . Difficulty of Paying Living Expenses:   Food Insecurity:   . Worried About Programme researcher, broadcasting/film/videounning Out of Food in the Last Year:   . Baristaan Out of Food in the Last Year:   Transportation Needs:    . Freight forwarderLack of Transportation (Medical):   Marland Kitchen. Lack of Transportation (Non-Medical):   Physical Activity:   . Days of Exercise per Week:   . Minutes of Exercise per Session:   Stress:   . Feeling of Stress :   Social Connections:   . Frequency of Communication with Friends and Family:   . Frequency of Social Gatherings with Friends and Family:   . Attends Religious Services:   . Active Member of Clubs or Organizations:   . Attends BankerClub or Organization Meetings:   Marland Kitchen. Marital Status:   Intimate Partner Violence:   . Fear of Current or Ex-Partner:   . Emotionally Abused:   Marland Kitchen. Physically Abused:   . Sexually Abused:     No outpatient medications prior to visit.   Facility-Administered Medications Prior to Visit  Medication Dose Route Frequency Provider Last Rate Last Admin  . technetium TC 6M mebrofenin (CHOLETEC) injection 4.5 millicurie  4.5 millicurie Intravenous Once PRN Loralie ChampagneGallerani, Mark, MD        Allergies  Allergen Reactions  . Ciprofloxacin Other (See Comments)    Headache        Objective:    Physical Exam Vitals and nursing note reviewed.  Constitutional:      Appearance: She is well-developed.  HENT:     Head: Normocephalic.     Mouth/Throat:     Mouth: Mucous membranes are moist.     Pharynx: Oropharynx is clear.  Neck:     Thyroid: No thyromegaly.  Cardiovascular:     Rate and Rhythm: Regular rhythm. Tachycardia present.  No extrasystoles are present.    Pulses: Normal pulses. No decreased pulses.     Heart sounds: Normal heart sounds. No murmur heard.  No gallop.   Pulmonary:     Effort: Pulmonary effort is normal. No respiratory distress.     Breath sounds: Normal breath sounds. No decreased breath sounds, wheezing, rhonchi or rales.  Chest:     Chest wall: No mass, tenderness or edema. There is no dullness to percussion.  Abdominal:     General: Bowel sounds are normal.     Palpations: Abdomen is soft. There is no hepatomegaly, splenomegaly or mass.      Tenderness: There is no guarding or rebound.  Musculoskeletal:     Cervical back: Normal range of motion.     Right lower leg: No tenderness. No edema.     Left lower leg: No tenderness. No edema.  Skin:    General: Skin is warm and dry.     Capillary Refill: Capillary refill takes less than 2  seconds.     Coloration: Skin is pale.  Neurological:     General: No focal deficit present.     Mental Status: She is alert and oriented to person, place, and time.  Psychiatric:        Mood and Affect: Mood is anxious.        Behavior: Behavior normal.       BP 103/72   Pulse 96   Temp 98.7 F (37.1 C) (Oral)   Ht 5\' 6"  (1.676 m)   Wt 134 lb 1.6 oz (60.8 kg)   LMP 02/16/2020 (Exact Date) Comment: HUSBAND HAD VASECTOMY  SpO2 98%   BMI 21.64 kg/m  Wt Readings from Last 3 Encounters:  02/26/20 134 lb 1.6 oz (60.8 kg)  01/15/20 136 lb (61.7 kg)  12/29/19 136 lb 8 oz (61.9 kg)    Health Maintenance Due  Topic Date Due  . INFLUENZA VACCINE  02/25/2020    There are no preventive care reminders to display for this patient.   Lab Results  Component Value Date   TSH 1.82 06/01/2018   Lab Results  Component Value Date   WBC 4.1 12/29/2019   HGB 12.8 12/29/2019   HCT 39.5 12/29/2019   MCV 91.6 12/29/2019   PLT 194 12/29/2019   Lab Results  Component Value Date   NA 140 11/23/2019   K 4.0 11/23/2019   CO2 26 11/23/2019   GLUCOSE 96 11/23/2019   BUN 10 11/23/2019   CREATININE 0.82 11/23/2019   BILITOT 0.7 11/23/2019   AST 12 11/23/2019   ALT 6 11/23/2019   PROT 7.0 11/23/2019   CALCIUM 9.4 11/23/2019   Lab Results  Component Value Date   CHOL 193 06/01/2018   Lab Results  Component Value Date   HDL 55 06/01/2018   Lab Results  Component Value Date   LDLCALC 121 (H) 06/01/2018   Lab Results  Component Value Date   TRIG 75 06/01/2018   Lab Results  Component Value Date   CHOLHDL 3.5 06/01/2018   No results found for: HGBA1C     Assessment &  Plan:   1. Anxiety Symptoms and presentation consistent with increased anxiety. I do feel that she would benefit from having her thryroid levels checked to determine if there is a correlation with the anxiety, palpitations, and weight loss she has been experiencing. We discussed counseling and medication options that may be helpful for her symptoms. She was provided with information on sertraline and we discussed starting low dose to see if this is helpful for her symptoms. She plans to read about the medication and notify me if she starts this later in the week. She is hesitant to start daily medication because she does not like taking medication, but endorses that if it helps her mood and symptoms that it would be beneficial.  - sertraline (ZOLOFT) 25 MG tablet; Take 1 tablet (25 mg total) by mouth at bedtime.  Dispense: 30 tablet; Refill: 2 - TSH(Reflex)  2. Shortness of breath Shortness of breath in the setting of increased anxiety. EKG today was normal and breath sounds all clear with no signs of consolidation or respiratory compromise. Her oxygen saturations in the office and at home have all been WNL. I do suspect that her anxiety may be contributing to her symptoms. I will test her thyroid today to evaluate for dysfunction that could be exacerbating her symptoms. Her hemoglobin in the office today was 12.9, so not concerns of anemia at this  time.  - EKG 12-Lead - TSH(Reflex)  3. Intermittent palpitations Palpitations and elevation of heart rate with no known definitive cause. She does endorse increased anxiety and worry, which very well could be contributing to these symptoms. Hemoglobin 12.9 in the office today and hemoglobin A1c was 4.7%. We will check her TSH to see if thyroid dysfunction could be contributing to her symptoms. She most certainly has an element of anxiety present. I am hopeful that she will consider sertraline daily to help with her symptoms.  - POCT hemoglobin - POCT HgB  A1C - TSH(Reflex)  4. Weight loss, unintentional Unintentional weight loss with other symptoms including palpitations, chest tightness, anxiety, and GI disturbance. Hemoglobin A1c was 4.7% today and hemoglobin was 12.9. She recently underwent an upper GI evaluation, which was negative. She did have a Hida scan today, and we area awaiting results. I will check her thyroid to determine if this is a component of her symptoms. I do feel that her anxiety may have a significant impact on her health. We have discussed medication and counseling and she is to consider both and let me know if she chooses to start medication.  - POCT hemoglobin - POCT HgB A1C - TSH(Reflex)  Return in about 3 months (around 05/28/2020) for 2-3 months Mood.    Tollie Eth, NP

## 2020-02-26 NOTE — Progress Notes (Signed)
This RN summoned to Molecular Imaging Department to assist with patient experiencing near-syncopal episode. Upon arrival, patient noted to be sitting upright in chair with damp washcloth held on forehead by MI staff. Patient noted to be diaphoretic, with pallor. Multiple MI staff at patient's side. Stated patient had just received IV placement immediately prior to near-syncopal episode. Patient oriented x4. Stimulated by waving alcohol prep pad under nose, with appropriate response. Patient transferred to bed by MI techs and foot of bed raised above level of patient's heart. Patient stated she felt better and vital signs obtained. See Flowsheets. Pink color returning to patient's face and lips. TC placed to Dr Pecolia Ades and informed of above. No new orders. OK to proceed with MI scan per Dr Pecolia Ades. Patient talking with this RN while MI scan procedure begun. MI staff instructed to contact IR nursing staff if any further concerns.  Lisbeth Ply, RN

## 2020-02-26 NOTE — Patient Instructions (Addendum)
Generalized Anxiety Disorder, Adult Generalized anxiety disorder (GAD) is a mental health disorder. People with this condition constantly worry about everyday events. Unlike normal anxiety, worry related to GAD is not triggered by a specific event. These worries also do not fade or get better with time. GAD interferes with life functions, including relationships, work, and school. GAD can vary from mild to severe. People with severe GAD can have intense waves of anxiety with physical symptoms (panic attacks). What are the causes? The exact cause of GAD is not known. What increases the risk? This condition is more likely to develop in:  Women.  People who have a family history of anxiety disorders.  People who are very shy.  People who experience very stressful life events, such as the death of a loved one.  People who have a very stressful family environment. What are the signs or symptoms? People with GAD often worry excessively about many things in their lives, such as their health and family. They may also be overly concerned about:  Doing well at work.  Being on time.  Natural disasters.  Friendships. Physical symptoms of GAD include:  Fatigue.  Muscle tension or having muscle twitches.  Trembling or feeling shaky.  Being easily startled.  Feeling like your heart is pounding or racing.  Feeling out of breath or like you cannot take a deep breath.  Having trouble falling asleep or staying asleep.  Sweating.  Nausea, diarrhea, or irritable bowel syndrome (IBS).  Headaches.  Trouble concentrating or remembering facts.  Restlessness.  Irritability. How is this diagnosed? Your health care provider can diagnose GAD based on your symptoms and medical history. You will also have a physical exam. The health care provider will ask specific questions about your symptoms, including how severe they are, when they started, and if they come and go. Your health care  provider may ask you about your use of alcohol or drugs, including prescription medicines. Your health care provider may refer you to a mental health specialist for further evaluation. Your health care provider will do a thorough examination and may perform additional tests to rule out other possible causes of your symptoms. To be diagnosed with GAD, a person must have anxiety that:  Is out of his or her control.  Affects several different aspects of his or her life, such as work and relationships.  Causes distress that makes him or her unable to take part in normal activities.  Includes at least three physical symptoms of GAD, such as restlessness, fatigue, trouble concentrating, irritability, muscle tension, or sleep problems. Before your health care provider can confirm a diagnosis of GAD, these symptoms must be present more days than they are not, and they must last for six months or longer. How is this treated? The following therapies are usually used to treat GAD:  Medicine. Antidepressant medicine is usually prescribed for long-term daily control. Antianxiety medicines may be added in severe cases, especially when panic attacks occur.  Talk therapy (psychotherapy). Certain types of talk therapy can be helpful in treating GAD by providing support, education, and guidance. Options include: ? Cognitive behavioral therapy (CBT). People learn coping skills and techniques to ease their anxiety. They learn to identify unrealistic or negative thoughts and behaviors and to replace them with positive ones. ? Acceptance and commitment therapy (ACT). This treatment teaches people how to be mindful as a way to cope with unwanted thoughts and feelings. ? Biofeedback. This process trains you to manage your body's response (  physiological response) through breathing techniques and relaxation methods. You will work with a therapist while machines are used to monitor your physical symptoms.  Stress  management techniques. These include yoga, meditation, and exercise. A mental health specialist can help determine which treatment is best for you. Some people see improvement with one type of therapy. However, other people require a combination of therapies. Follow these instructions at home:  Take over-the-counter and prescription medicines only as told by your health care provider.  Try to maintain a normal routine.  Try to anticipate stressful situations and allow extra time to manage them.  Practice any stress management or self-calming techniques as taught by your health care provider.  Do not punish yourself for setbacks or for not making progress.  Try to recognize your accomplishments, even if they are small.  Keep all follow-up visits as told by your health care provider. This is important. Contact a health care provider if:  Your symptoms do not get better.  Your symptoms get worse.  You have signs of depression, such as: ? A persistently sad, cranky, or irritable mood. ? Loss of enjoyment in activities that used to bring you joy. ? Change in weight or eating. ? Changes in sleeping habits. ? Avoiding friends or family members. ? Loss of energy for normal tasks. ? Feelings of guilt or worthlessness. Get help right away if:  You have serious thoughts about hurting yourself or others. If you ever feel like you may hurt yourself or others, or have thoughts about taking your own life, get help right away. You can go to your nearest emergency department or call:  Your local emergency services (911 in the [place]  A suicide crisis helpline, such as the National Suicide Prevention Lifeline at 534-153-6308. This is open 24 hours a day. Summary  Generalized anxiety disorder (GAD) is a mental health disorder that involves worry that is not triggered by a specific event.  People with GAD often worry excessively about many things in their lives, such as their health and  family.  GAD may cause physical symptoms such as restlessness, trouble concentrating, sleep problems, frequent sweating, nausea, diarrhea, headaches, and trembling or muscle twitching.  A mental health specialist can help determine which treatment is best for you. Some people see improvement with one type of therapy. However, other people require a combination of therapies. This information is not intended to replace advice given to you by your health care provider. Make sure you discuss any questions you have with your health care provider. Document Revised: 06/25/2017 Document Reviewed: 06/02/2016 Elsevier Patient Education  2020 [employer]     Sertraline tablets What is this medicine? SERTRALINE (SER tra leen) is used to treat depression. It may also be used to treat obsessive compulsive disorder, panic disorder, post-trauma stress, premenstrual dysphoric disorder (PMDD) or social anxiety. This medicine may be used for other purposes; ask your health care provider or pharmacist if you have questions. COMMON BRAND NAME(S): Zoloft What should I tell my health care provider before I take this medicine? They need to know if you have any of these conditions:  bleeding disorders  bipolar disorder or a family history of bipolar disorder  glaucoma  heart disease  high blood pressure  history of irregular heartbeat  history of low levels of calcium, magnesium, or potassium in the blood  if you often drink alcohol  liver disease  receiving electroconvulsive therapy  seizures  suicidal thoughts, plans, or attempt; a previous suicide attempt  by you or a family member  take medicines that treat or prevent blood clots  thyroid disease  an unusual or allergic reaction to sertraline, other medicines, foods, dyes, or preservatives  pregnant or trying to get pregnant  breast-feeding How should I use this medicine? Take this medicine by mouth with a glass of water. Follow  the directions on the prescription label. You can take it with or without food. Take your medicine at regular intervals. Do not take your medicine more often than directed. Do not stop taking this medicine suddenly except upon the advice of your doctor. Stopping this medicine too quickly may cause serious side effects or your condition may worsen. A special MedGuide will be given to you by the pharmacist with each prescription and refill. Be sure to read this information carefully each time. Talk to your pediatrician regarding the use of this medicine in children. While this drug may be prescribed for children as young as 7 years for selected conditions, precautions do apply. Overdosage: If you think you have taken too much of this medicine contact a poison control center or emergency room at once. NOTE: This medicine is only for you. Do not share this medicine with others. What if I miss a dose? If you miss a dose, take it as soon as you can. If it is almost time for your next dose, take only that dose. Do not take double or extra doses. What may interact with this medicine? Do not take this medicine with any of the following medications:  cisapride  dronedarone  linezolid  MAOIs like Carbex, Eldepryl, Marplan, Nardil, and Parnate  methylene blue (injected into a vein)  pimozide  thioridazine This medicine may also interact with the following medications:  alcohol  amphetamines  aspirin and aspirin-like medicines  certain medicines for depression, anxiety, or psychotic disturbances  certain medicines for fungal infections like ketoconazole, fluconazole, posaconazole, and itraconazole  certain medicines for irregular heart beat like flecainide, quinidine, propafenone  certain medicines for migraine headaches like almotriptan, eletriptan, frovatriptan, naratriptan, rizatriptan, sumatriptan, zolmitriptan  certain medicines for sleep  certain medicines for seizures like  carbamazepine, valproic acid, phenytoin  certain medicines that treat or prevent blood clots like warfarin, enoxaparin, dalteparin  cimetidine  digoxin  diuretics  fentanyl  isoniazid  lithium  NSAIDs, medicines for pain and inflammation, like ibuprofen or naproxen  other medicines that prolong the QT interval (cause an abnormal heart rhythm) like dofetilide  rasagiline  safinamide  supplements like St. John's wort, kava kava, valerian  tolbutamide  tramadol  tryptophan This list may not describe all possible interactions. Give your health care provider a list of all the medicines, herbs, non-prescription drugs, or dietary supplements you use. Also tell them if you smoke, drink alcohol, or use illegal drugs. Some items may interact with your medicine. What should I watch for while using this medicine? Tell your doctor if your symptoms do not get better or if they get worse. Visit your doctor or health care professional for regular checks on your progress. Because it may take several weeks to see the full effects of this medicine, it is important to continue your treatment as prescribed by your doctor. Patients and their families should watch out for new or worsening thoughts of suicide or depression. Also watch out for sudden changes in feelings such as feeling anxious, agitated, panicky, irritable, hostile, aggressive, impulsive, severely restless, overly excited and hyperactive, or not being able to sleep. If this happens, especially at  the beginning of treatment or after a change in dose, call your health care professional. Bonita Quin may get drowsy or dizzy. Do not drive, use machinery, or do anything that needs mental alertness until you know how this medicine affects you. Do not stand or sit up quickly, especially if you are an older patient. This reduces the risk of dizzy or fainting spells. Alcohol may interfere with the effect of this medicine. Avoid alcoholic drinks. Your mouth  may get dry. Chewing sugarless gum or sucking hard candy, and drinking plenty of water may help. Contact your doctor if the problem does not go away or is severe. What side effects may I notice from receiving this medicine? Side effects that you should report to your doctor or health care professional as soon as possible:  allergic reactions like skin rash, itching or hives, swelling of the face, lips, or tongue  anxious  black, tarry stools  changes in vision  confusion  elevated mood, decreased need for sleep, racing thoughts, impulsive behavior  eye pain  fast, irregular heartbeat  feeling faint or lightheaded, falls  feeling agitated, angry, or irritable  hallucination, loss of contact with reality  loss of balance or coordination  loss of memory  painful or prolonged erections  restlessness, pacing, inability to keep still  seizures  stiff muscles  suicidal thoughts or other mood changes  trouble sleeping  unusual bleeding or bruising  unusually weak or tired  vomiting Side effects that usually do not require medical attention (report to your doctor or health care professional if they continue or are bothersome):  change in appetite or weight  change in sex drive or performance  diarrhea  increased sweating  indigestion, nausea  tremors This list may not describe all possible side effects. Call your doctor for medical advice about side effects. You may report side effects to FDA at 1-800-FDA-1088. Where should I keep my medicine? Keep out of the reach of children. Store at room temperature between 15 and 30 degrees C (59 and 86 degrees F). Throw away any unused medicine after the expiration date. NOTE: This sheet is a summary. It may not cover all possible information. If you have questions about this medicine, talk to your doctor, pharmacist, or health care provider.  2020 Elsevier/Gold Standard (2018-07-05 10:09:27)

## 2020-03-11 ENCOUNTER — Other Ambulatory Visit: Payer: Self-pay | Admitting: Nurse Practitioner

## 2020-03-11 DIAGNOSIS — F419 Anxiety disorder, unspecified: Secondary | ICD-10-CM

## 2020-03-13 ENCOUNTER — Ambulatory Visit: Payer: BC Managed Care – PPO | Admitting: Gastroenterology

## 2020-04-29 ENCOUNTER — Ambulatory Visit (INDEPENDENT_AMBULATORY_CARE_PROVIDER_SITE_OTHER): Payer: BC Managed Care – PPO | Admitting: Gastroenterology

## 2020-04-29 ENCOUNTER — Encounter: Payer: Self-pay | Admitting: Gastroenterology

## 2020-04-29 ENCOUNTER — Other Ambulatory Visit: Payer: BC Managed Care – PPO

## 2020-04-29 VITALS — BP 90/68 | HR 108 | Ht 66.5 in | Wt 135.0 lb

## 2020-04-29 DIAGNOSIS — K824 Cholesterolosis of gallbladder: Secondary | ICD-10-CM

## 2020-04-29 DIAGNOSIS — G8929 Other chronic pain: Secondary | ICD-10-CM

## 2020-04-29 DIAGNOSIS — R161 Splenomegaly, not elsewhere classified: Secondary | ICD-10-CM

## 2020-04-29 DIAGNOSIS — R1013 Epigastric pain: Secondary | ICD-10-CM

## 2020-04-29 MED ORDER — FAMOTIDINE 20 MG PO TABS
20.0000 mg | ORAL_TABLET | Freq: Two times a day (BID) | ORAL | 2 refills | Status: DC
Start: 2020-04-29 — End: 2021-05-27

## 2020-04-29 NOTE — Patient Instructions (Addendum)
If you are age 29 or older, your body mass index should be between 23-30. Your Body mass index is 21.46 kg/m. If this is out of the aforementioned range listed, please consider follow up with your Primary Care Provider.  If you are age 53 or younger, your body mass index should be between 19-25. Your Body mass index is 21.46 kg/m. If this is out of the aformentioned range listed, please consider follow up with your Primary Care Provider.   Please go to the lab in the basement of our building to have lab work done as you leave today. Hit "B" for basement when you get on the elevator.  When the doors open the lab is on your left.  We will call you with the results. Thank you.  Due to recent changes in healthcare laws, you may see the results of your imaging and laboratory studies on MyChart before your provider has had a chance to review them.  We understand that in some cases there may be results that are confusing or concerning to you. Not all laboratory results come back in the same time frame and the provider may be waiting for multiple results in order to interpret others.  Please give [place] 48 hours in order for your provider to thoroughly review all the results before contacting the office for clarification of your results.    I have recommended a trial of famotidine 20 mg twice daily for the next 12 weeks to treat the reflux and peptic duodenitis seen on your upper endoscopy. We have sent a prescription to your pharmacy for you to pick up at your convenience.  Your ultrasounds showed small gallbladder polyps. Our GI guidelines suggest repeating an ultrasound in one year to be sure that the polyps have not grown in size. We will plan another ultrasound in April 2022.   Abdominal pain and diarrhea can be triggered by a meat allergen. I have recommended testing for Alpha Gal. This is a blood test to have performed in the lab.   Your symptoms may be related anxiety. Treating the anxiety may improve  your pain and diarrhea.   Belly breathing can help improve your GI symptoms. Sometimes this is called abdominal breathing or diaphragmatic breathing.   Sit upright in a chair. Your spine should be straight, knees bent, shoulders loose, and head and neck relaxed.  You mouth can be slightly open with teeth separated.   Place on hand on your chest and one hand on your abdomen, just below the rib cage.  Breathe in for 4 second through your nose and fee your stomach pushing out against your hand. Try to keep the hand on your chest from moving.   Hold your stomach muscles tight and then start to let your belly deflate as you exhale for 6 seconds. It helps to purse your lips on the exhale like you are blowing through a straw.   I like this video from the [employer] [place] if you would like more information: [email or web address]  Please call me with any additional questions or concerns.   Dr. Tressia Danas
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
## 2020-05-02 LAB — ALLERGEN PROFILE, FOOD-MEAT
Beef IgE: <0.1 kU/L
Chicken IgE: <0.1 kU/L
Pork IgE: <0.1 kU/L

## 2020-08-27 ENCOUNTER — Ambulatory Visit (INDEPENDENT_AMBULATORY_CARE_PROVIDER_SITE_OTHER): Payer: BC Managed Care – PPO | Admitting: Family Medicine

## 2020-08-27 ENCOUNTER — Other Ambulatory Visit: Payer: Self-pay

## 2020-08-27 ENCOUNTER — Encounter: Payer: Self-pay | Admitting: Family Medicine

## 2020-08-27 VITALS — BP 107/65 | HR 80 | Ht 67.0 in | Wt 138.0 lb

## 2020-08-27 DIAGNOSIS — L739 Follicular disorder, unspecified: Secondary | ICD-10-CM | POA: Diagnosis not present

## 2020-08-27 DIAGNOSIS — R058 Other specified cough: Secondary | ICD-10-CM | POA: Diagnosis not present

## 2020-08-27 DIAGNOSIS — R059 Cough, unspecified: Secondary | ICD-10-CM | POA: Diagnosis not present

## 2020-08-27 MED ORDER — MUPIROCIN 2 % EX OINT: TOPICAL_OINTMENT | Freq: Three times a day (TID) | CUTANEOUS | 0 refills | Status: DC | PRN

## 2020-08-27 NOTE — Progress Notes (Signed)
Established Patient Office Visit  Subjective:  Patient ID: Cindy Dunlap, female    DOB: 1991/04/25  Age: 30 y.o. MRN: [id number]  CC:  Chief Complaint  Patient presents with  . Abdominal Pain  . folliculitis    R leg x r days  . chest congestion    X1 month she reports that she has tried mucinex and claritin for this and it didn't help    HPI Cindy Dunlap presents for   Knot on the right out leg.  Painful last night.  She has been pressing around it. She said initially this morning she saw a red streak going up towards her knee but took some Motrin this morning and has been doing some warm compresses and says it actually looks better now. She does not member any injury or trauma. She did shave on Friday night.    She thinks she might have had COVID at Christmas ( neg home test but potential exposure)  and still has some thick mucous. Says it is mostly clear. She has had some low-grade temps on and off maybe a couple times a week where it was 99. Otherwise feeling well and doing well. Still draining.  Tried Claritn and Mucinex for [employer]. No shortness of breath. She feels like the mucus is mostly coming from her sinuses and then triggering the cough it always seems to be worse in the evening.  Past Medical History:  Diagnosis Date  . GERD (gastroesophageal reflux disease)     Past Surgical History:  Procedure Laterality Date  . DILATION AND CURETTAGE OF UTERUS  2015    Family History  Problem Relation Age of Onset  . Hyperlipidemia Father   . Hypertension Father   . Heart attack Father   . Diabetes Paternal Grandmother   . Stroke Paternal Grandmother   . Heart attack Paternal Grandmother   . Cancer Paternal Grandfather        LIVER  . Heart attack Paternal Grandfather   . Colon cancer Maternal Uncle   . Lymphoma Maternal Grandmother   . Dementia Maternal Grandfather   . Stomach cancer Paternal Uncle   . Esophageal cancer Paternal Uncle   . Pancreatic cancer Neg Hx    . Rectal cancer Neg Hx     Social History   Socioeconomic History  . Marital status: Married    Spouse name: Not on file  . Number of children: Not on file  . Years of education: Not on file  . Highest education level: Not on file  Occupational History  . Not on file  Tobacco Use  . Smoking status: Never Smoker  . Smokeless tobacco: Never Used  Vaping Use  . Vaping Use: Never used  Substance and Sexual Activity  . Alcohol use: Never    Alcohol/week: 0.0 standard drinks  . Drug use: Never  . Sexual activity: Not on file  Other Topics Concern  . Not on file  Social History Narrative  . Not on file   Social Determinants of Health   Financial Resource Strain: Not on file  Food Insecurity: Not on file  Transportation Needs: Not on file  Physical Activity: Not on file  Stress: Not on file  Social Connections: Not on file  Intimate Partner Violence: Not on file    Outpatient Medications Prior to Visit  Medication Sig Dispense Refill  . famotidine (PEPCID) 20 MG tablet Take 1 tablet (20 mg total) by mouth 2 (two) times daily. 30 tablet 2  No facility-administered medications prior to visit.    Allergies  Allergen Reactions  . Ciprofloxacin Other (See Comments)    Headache     ROS Review of Systems    Objective:    Physical Exam Constitutional:      Appearance: She is well-developed.  HENT:     Head: Normocephalic and atraumatic.     Right Ear: Tympanic membrane, ear canal and external ear normal.     Left Ear: Tympanic membrane, ear canal and external ear normal.     Nose: Nose normal.     Mouth/Throat:     Mouth: Mucous membranes are moist.     Pharynx: Oropharynx is clear. No oropharyngeal exudate or posterior oropharyngeal erythema.  Eyes:     Conjunctiva/sclera: Conjunctivae normal.     Pupils: Pupils are equal, round, and reactive to light.  Neck:     Thyroid: No thyromegaly.  Cardiovascular:     Rate and Rhythm: Normal rate and regular  rhythm.     Heart sounds: Normal heart sounds.  Pulmonary:     Effort: Pulmonary effort is normal.     Breath sounds: Normal breath sounds. No wheezing.  Musculoskeletal:     Cervical back: Neck supple.  Lymphadenopathy:     Cervical: No cervical adenopathy.  Skin:    General: Skin is warm and dry.  Neurological:     Mental Status: She is alert and oriented to person, place, and time.     BP 107/65   Pulse 80   Ht 5\' 7"  (1.702 m)   Wt 138 lb (62.6 kg)   SpO2 94%   BMI 21.61 kg/m  Wt Readings from Last 3 Encounters:  08/27/20 138 lb (62.6 kg)  04/29/20 135 lb (61.2 kg)  02/26/20 134 lb 1.6 oz (60.8 kg)     Health Maintenance Due  Topic Date Due  . Hepatitis C Screening  Never done  . HIV Screening  Never done  . TETANUS/TDAP  Never done  . PAP-Cervical Cytology Screening  12/16/2019  . PAP SMEAR-Modifier  12/16/2019  . INFLUENZA VACCINE  Never done    There are no preventive care reminders to display for this patient.  Lab Results  Component Value Date   TSH 1.82 06/01/2018   Lab Results  Component Value Date   WBC 4.1 12/29/2019   HGB 12.9 02/26/2020   HCT 39.5 12/29/2019   MCV 91.6 12/29/2019   PLT 194 12/29/2019   Lab Results  Component Value Date   NA 140 11/23/2019   K 4.0 11/23/2019   CO2 26 11/23/2019   GLUCOSE 96 11/23/2019   BUN 10 11/23/2019   CREATININE 0.82 11/23/2019   BILITOT 0.7 11/23/2019   AST 12 11/23/2019   ALT 6 11/23/2019   PROT 7.0 11/23/2019   CALCIUM 9.4 11/23/2019   Lab Results  Component Value Date   CHOL 193 06/01/2018   Lab Results  Component Value Date   HDL 55 06/01/2018   Lab Results  Component Value Date   LDLCALC 121 (H) 06/01/2018   Lab Results  Component Value Date   TRIG 75 06/01/2018   Lab Results  Component Value Date   CHOLHDL 3.5 06/01/2018   Lab Results  Component Value Date   HGBA1C 4.7 02/26/2020      Assessment & Plan:   Problem List Items Addressed This Visit   None   Visit  Diagnoses    Folliculitis    -  Primary   Cough  Post-viral cough syndrome         Folliculitis-it looks most consistent with an inflamed follicle cold. It definitely is quite firm and measuring approximately a centimeter. No pustule on the surface we discussed continuing warm compresses and using a little mupirocin ointment on it. If any point she feels like it is getting larger, its not resolving over the next couple of days, or becomes more painful then please let me know if we need to use an oral antibiotic we can but explained to her that most of the time these will resolve on their own.  Post viral cough-she has been gradually getting better and new this is been frustrating. We discussed just continuing to stay well-hydrated, running a humidifier, okay to discontinue the over-the-counter's as I do not think they are really helping at this point. Chest exam is clear so gave her reassurance that this may last for a couple more weeks before completely goes away   Meds ordered this encounter  Medications  . mupirocin ointment (BACTROBAN) 2 %    Sig: Apply topically 3 (three) times daily as needed.    Dispense:  30 g    Refill:  0    Follow-up: Return if symptoms worsen or fail to improve.    Nani Gasser, MD

## 2020-09-15 IMAGING — NM NM HEPATO W/GB/PHARM/[PERSON_NAME]
2 series · 12 of 12 positions shown · non-contrast
Comparison: None.

CLINICAL DATA: Chronic epigastric and right upper quadrant
abdominal pain.

EXAM:
NUCLEAR MEDICINE HEPATOBILIARY IMAGING WITH GALLBLADDER EF
TECHNIQUE: Sequential images of the abdomen were obtained [DATE] minutes
following intravenous administration of radiopharmaceutical. After
oral ingestion of Ensure, gallbladder ejection fraction was
determined. At 60 min, normal ejection fraction is greater than 33%.
RADIOPHARMACEUTICALS:  4.5 mCi Wc-EEm  Choletec IV

[Series 1: biliary · 3.25mm/px · 6 of 60 frames shown]
[frame 6/60]
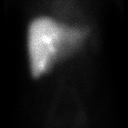
[frame 16/60]
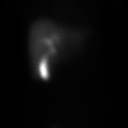
[frame 26/60]
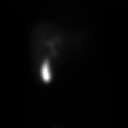
[frame 36/60]
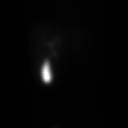
[frame 46/60]
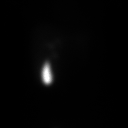
[frame 56/60]
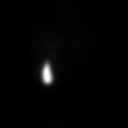

[Series 2: gbef · 3.25mm/px · 6 of 45 frames shown]
[frame 4/45]
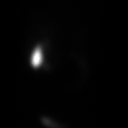
[frame 12/45]
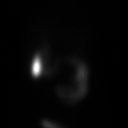
[frame 19/45]
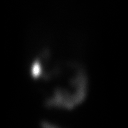
[frame 27/45]
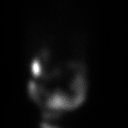
[frame 34/45]
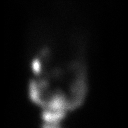
[frame 42/45]
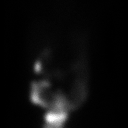

[12 of 12 positions shown; findings below may reference images not displayed]

FINDINGS: Prompt uptake and biliary excretion of activity by the liver is
seen. Gallbladder activity is visualized, consistent with patency of
cystic duct. Biliary activity passes into small bowel, consistent
with patent common bile duct.

Calculated gallbladder ejection fraction is 92%. (Normal gallbladder
ejection fraction with Ensure is greater than 33%.)
IMPRESSION: Normal hepatobiliary scan, demonstrating patency of cystic and
common bile ducts.

Normal gallbladder ejection fraction.

## 2020-10-07 ENCOUNTER — Ambulatory Visit (INDEPENDENT_AMBULATORY_CARE_PROVIDER_SITE_OTHER): Payer: BC Managed Care – PPO | Admitting: Osteopathic Medicine

## 2020-10-07 ENCOUNTER — Other Ambulatory Visit: Payer: Self-pay

## 2020-10-07 ENCOUNTER — Encounter: Payer: Self-pay | Admitting: Osteopathic Medicine

## 2020-10-07 VITALS — BP 112/77 | HR 94 | Resp 20 | Ht 67.0 in | Wt 139.0 lb

## 2020-10-07 DIAGNOSIS — Z0000 Encounter for general adult medical examination without abnormal findings: Secondary | ICD-10-CM | POA: Diagnosis not present

## 2020-10-07 DIAGNOSIS — F419 Anxiety disorder, unspecified: Secondary | ICD-10-CM

## 2020-10-07 DIAGNOSIS — R002 Palpitations: Secondary | ICD-10-CM

## 2020-10-07 NOTE — Progress Notes (Signed)
Cindy Dunlap is a 30 y.o. female who presents to  [facility] at [facility]  today, 10/07/20, seeking care for the following:  . Annual physical  . Concern for feeling a bit short winder over the past week, improving more and more over the past few days, feels difficulty getting a deep breath in. No known triggers, no recent illness though notes few viral-type respiratory illnesses over the past few mos all testing negative for COVID. No CP. Reports very occasional palpitations maybe once every couple months lasting a few moments then resolves spontaneously.      ASSESSMENT & PLAN with other pertinent findings:  The primary encounter diagnosis was Annual physical exam. Diagnoses of Intermittent palpitations and Anxiety were also pertinent to this visit.   RTC if SOB recurs, would fer PFT. Resolving symptoms reassuring, seems likely postviral vs possible adult onset asthma. Probably an anxiety component, pt knows to come see me if any desire to discuss meds/BH referral. Consider cardiology referral for intermittent palpitations / see me if worse or more frequent but at this point I don't think a short-term monitor will be helpful.    Patient Instructions  General Preventive Care  Most recent routine screening labs: ordered.   Blood pressure goal 130/80 or less.   Tobacco: don't!  Alcohol: responsible moderation is ok for most adults - if you have concerns about your alcohol intake, please talk to me!   Exercise: as tolerated to reduce risk of cardiovascular disease and diabetes. Strength training will also prevent osteoporosis.   Mental health: if need for mental health care (medicines, counseling, other), or concerns about moods, please let me know!   Sexual / Reproductive health: if need for STD testing, or if concerns with libido/pain problems, please let me know! If you need to discuss family planning, please let me know!   Advanced  Directive: Living Will and/or Healthcare Power of Attorney recommended for all adults, regardless of age or health.  Vaccines  Flu vaccine: for almost everyone, every fall.   Shingles vaccine: after age 3.   Pneumonia vaccines: after age 75.  Tetanus booster: every 10 years / 3rd trimester of pregnancy  HPV vaccine: Gardasil up to age 60 to prevent HPV-associated diseases, including certain cancers.   COVID vaccine: STRONGLY RECOMMENDED  Cancer screenings   Colon cancer screening: for everyone age 59-75.   Breast cancer screening: mammogram at age 40  Cervical cancer screening: Pap per OBGYN  Lung cancer screening: not needed for non-smokers Infection screenings  . HIV: recommended screening at least once age 45-65, more often as needed. Nanetta Batty, other STI: screening as needed . Hepatitis C: recommended once for everyone age 41-75 . TB: at-risk populations, or depending on work requirements and/or travel history   Orders Placed This Encounter  Procedures  . CBC  . COMPLETE METABOLIC PANEL WITH GFR  . Lipid panel  . TSH  . Magnesium    No orders of the defined types were placed in this encounter.    See below for relevant physical exam findings  See below for recent lab and imaging results reviewed  Medications, allergies, PMH, PSH, SocH, FamH reviewed below    Follow-up instructions: Return in about 1 year (around 10/07/2021) for Brandt (call week prior to visit for lab orders).  Exam:  BP 112/77 (BP Location: Left Arm, Patient Position: Sitting, Cuff Size: Normal)   Pulse 94   Resp 20   Ht 5\' 7"  (1.702 m)   Wt 139 lb (63 kg)   SpO2 99%   BMI 21.77 kg/m   Constitutional: VS see above. General Appearance: alert, well-developed, well-nourished, NAD  Neck: No masses, trachea midline.   Respiratory: Normal respiratory effort. no wheeze, no rhonchi, no  rales  Cardiovascular: S1/S2 normal, no murmur, no rub/gallop auscultated. RRR.   Musculoskeletal: Gait normal. Symmetric and independent movement of all extremities  Abdominal: non-tender, non-distended, no appreciable organomegaly, neg Murphy's, BS WNLx4  Neurological: Normal balance/coordination. No tremor.  Skin: warm, dry, intact.   Psychiatric: Normal judgment/insight. Normal mood and affect. Oriented x3.   No outpatient medications have been marked as taking for the 10/07/20 encounter (Office Visit) with 10/09/20, DO.    Allergies  Allergen Reactions  . Ciprofloxacin Other (See Comments)    Headache     Patient Active Problem List   Diagnosis Date Noted  . Epigastric pain determined by examination 07/29/2018  . Nausea 07/29/2018  . History of urinary tract infection 07/29/2018  . Birth control 01/14/2016  . History of anemia 01/14/2016  . Family history of cardiac disorder 01/14/2016    Family History  Problem Relation Age of Onset  . Hyperlipidemia Father   . Hypertension Father   . Heart attack Father   . Diabetes Paternal Grandmother   . Stroke Paternal Grandmother   . Heart attack Paternal Grandmother   . Cancer Paternal Grandfather        LIVER  . Heart attack Paternal Grandfather   . Colon cancer Maternal Uncle   . Lymphoma Maternal Grandmother   . Dementia Maternal Grandfather   . Stomach cancer Paternal Uncle   . Esophageal cancer Paternal Uncle   . Pancreatic cancer Neg Hx   . Rectal cancer Neg Hx     Social History   Tobacco Use  Smoking Status Never Smoker  Smokeless Tobacco Never Used    Past Surgical History:  Procedure Laterality Date  . DILATION AND CURETTAGE OF UTERUS  2015    Immunization History  Administered Date(s) Administered  . Tdap 04/21/2007    No results found for this or any previous visit (from the past 2160 hour(s)).  No results found.     All questions at time of visit were answered - patient  instructed to contact office with any additional concerns or updates. ER/RTC precautions were reviewed with the patient as applicable.   Please note: manual typing as well as voice recognition software may have been used to produce this document - typos may escape review. Please contact Dr. [phone] for any needed clarifications.

## 2020-10-07 NOTE — Patient Instructions (Signed)
General Preventive Care  Most recent routine screening labs: ordered.   Blood pressure goal 130/80 or less.   Tobacco: don't!  Alcohol: responsible moderation is ok for most adults - if you have concerns about your alcohol intake, please talk to me!   Exercise: as tolerated to reduce risk of cardiovascular disease and diabetes. Strength training will also prevent osteoporosis.   Mental health: if need for mental health care (medicines, counseling, other), or concerns about moods, please let me know!   Sexual / Reproductive health: if need for STD testing, or if concerns with libido/pain problems, please let me know! If you need to discuss family planning, please let me know!   Advanced Directive: Living Will and/or Healthcare Power of Attorney recommended for all adults, regardless of age or health.  Vaccines  Flu vaccine: for almost everyone, every fall.   Shingles vaccine: after age 62.   Pneumonia vaccines: after age 44.  Tetanus booster: every 10 years / 3rd trimester of pregnancy  HPV vaccine: Gardasil up to age 6 to prevent HPV-associated diseases, including certain cancers.   COVID vaccine: STRONGLY RECOMMENDED  Cancer screenings   Colon cancer screening: for everyone age 55-75.   Breast cancer screening: mammogram at age 51  Cervical cancer screening: Pap per OBGYN  Lung cancer screening: not needed for non-smokers Infection screenings  . HIV: recommended screening at least once age 54-65, more often as needed. Nanetta Batty, other STI: screening as needed . Hepatitis C: recommended once for everyone age 56-75 . TB: at-risk populations, or depending on work requirements and/or travel history

## 2020-10-09 ENCOUNTER — Telehealth: Payer: Self-pay

## 2020-10-09 NOTE — Telephone Encounter (Signed)
Patient called stating that yesterday was a bad day experiencing SOB. Requesting recommendations from provider. Pls advise, thanks.

## 2020-10-10 NOTE — Telephone Encounter (Signed)
If serious SOB, needs visit to reevaluate, or ER/UC visit as needed. If mild SOB can schedule for lung function testing

## 2020-10-11 NOTE — Telephone Encounter (Signed)
Scheduled an appt for 10/18/2020 @ 10:00 and held a slot for Dr. Lyn Hollingshead @ 10:30. tvt

## 2020-10-11 NOTE — Telephone Encounter (Signed)
Please call and  schedule patient for spirometry.  She is having mild symptoms of shortness of breath.

## 2020-10-11 NOTE — Telephone Encounter (Signed)
Scheduled an appt for 10/18/2020 @ 10:00 and held a slot for Dr. Alexander @ 10:30. tvt 

## 2020-10-18 ENCOUNTER — Other Ambulatory Visit: Payer: BC Managed Care – PPO

## 2020-10-31 ENCOUNTER — Telehealth: Payer: Self-pay

## 2020-10-31 DIAGNOSIS — K824 Cholesterolosis of gallbladder: Secondary | ICD-10-CM

## 2020-10-31 DIAGNOSIS — R161 Splenomegaly, not elsewhere classified: Secondary | ICD-10-CM

## 2020-10-31 NOTE — Telephone Encounter (Signed)
Called and spoke to patient. She indicated she prefers Mondays and would like midmorning appointment.  Called and scheduled patient for Monday,

## 2020-10-31 NOTE — Telephone Encounter (Signed)
-----   Message from Cooper Render, CMA sent at 04/29/2020  3:43 PM EDT ----- Regarding: due for abdominal pain [place] patient seen in the office in 04-2020.  Needs a repeat ultrasound for gallbladder polyp and splenomegaly in late April 2022. Had last one 11-23-19.

## 2020-10-31 NOTE — Telephone Encounter (Signed)
Scheduled patient for Monday, 4-18 at 10:00am at [facility]. Per patient, sent her a MyChart message with appointment information.

## 2020-11-05 NOTE — Progress Notes (Signed)
Mailed letter to patient reminding her of her U/S scheduled for 11-11-20. Patient's Voicemail is full and will not receive messages.

## 2020-11-11 ENCOUNTER — Ambulatory Visit (HOSPITAL_COMMUNITY): Payer: BC Managed Care – PPO | Attending: Gastroenterology

## 2021-02-04 ENCOUNTER — Encounter: Payer: Self-pay | Admitting: Physician Assistant

## 2021-02-04 ENCOUNTER — Telehealth (INDEPENDENT_AMBULATORY_CARE_PROVIDER_SITE_OTHER): Payer: BC Managed Care – PPO | Admitting: Physician Assistant

## 2021-02-04 VITALS — Temp 99.2°F | Ht 67.0 in | Wt 139.0 lb

## 2021-02-04 DIAGNOSIS — J329 Chronic sinusitis, unspecified: Secondary | ICD-10-CM

## 2021-02-04 DIAGNOSIS — J40 Bronchitis, not specified as acute or chronic: Secondary | ICD-10-CM | POA: Diagnosis not present

## 2021-02-04 MED ORDER — ALBUTEROL SULFATE HFA 108 (90 BASE) MCG/ACT IN AERS: 2.0000 | INHALATION_SPRAY | Freq: Four times a day (QID) | RESPIRATORY_TRACT | 0 refills | Status: DC | PRN

## 2021-02-04 MED ORDER — AZITHROMYCIN 250 MG PO TABS: ORAL_TABLET | ORAL | 0 refills | Status: DC

## 2021-02-04 NOTE — Progress Notes (Signed)
Symptoms started last Tuesday/ after vacation from beach Headache Fatigue Sore throat Chills  NOW ongoing chest heaviness/cough/wheezing  Covid test negative  Taking: Mucinex, tylenol cold and flu

## 2021-02-04 NOTE — Progress Notes (Signed)
..  Virtual Visit via Video Note  I connected with Cindy Dunlap on 02/04/21 at  3:40 PM EDT by a video enabled telemedicine application and verified that I am speaking with the correct person using two identifiers.  Location: Patient: home Provider: clinic  .Marland KitchenParticipating in visit:  Patient: Cindy Dunlap Provider: Tandy Gaw PA-C   I discussed the limitations of evaluation and management by telemedicine and the availability of in person appointments. The patient expressed understanding and agreed to proceed.  History of Present Illness: Patient is a 30 year old female who calls into the clinic with 7 days of upper respiratory symptoms.  She and the whole house are sick with similar symptoms.  No one was tested for COVID.  She is not COVID vaccinated.  She did have COVID in September 2021 and symptoms did not feel like this.  She had extreme fatigue for 3 days and slept.  She reports lots of sinus pressure, congestion, ear popping that has now progressed into her chest.  She is having a lot of chest tightness and now productive cough. No previous lung issues or hx of asthma. No hx of smoking.  She feels like she is wheezing some now as well.  She is taking Mucinex, Tylenol Cold sinus severe with little improvement.   .. Active Ambulatory Problems    Diagnosis Date Noted   Birth control 01/14/2016   History of anemia 01/14/2016   Family history of cardiac disorder 01/14/2016   Epigastric pain determined by examination 07/29/2018   Nausea 07/29/2018   History of urinary tract infection 07/29/2018   Resolved Ambulatory Problems    Diagnosis Date Noted   Annual physical exam 01/14/2016   Influenza-like illness 08/28/2016   Hearing loss due to cerumen impaction, left 08/28/2016   Past Medical History:  Diagnosis Date   GERD (gastroesophageal reflux disease)        Observations/Objective: No acute distress No labored breathing or wheezing Productive cough Normal mood and  appearance  .Marland Kitchen Today's Vitals   02/04/21 1510  Temp: 99.2 F (37.3 C)  TempSrc: Oral  Weight: 139 lb (63 kg)  Height: 5\' 7"  (1.702 m)   Body mass index is 21.77 kg/m.    Assessment and Plan: Marland KitchenDiagnoses and all orders for this visit:  Sinobronchitis -     azithromycin (ZITHROMAX Z-PAK) 250 MG tablet; Take 2 tablets (500 mg) on  Day 1,  followed by 1 tablet (250 mg) once daily on Days 2 through 5. -     albuterol (VENTOLIN HFA) 108 (90 Base) MCG/ACT inhaler; Inhale 2 puffs into the lungs every 6 (six) hours as needed.  Likely viral that is transitioning into secondary bacterial sinus infection. Started zpak and added albuterol for bronchitis. Consider flonase and nasal sinus rinses. Rest and hydrate. If not improving or worsening call office. Consider prednisone if not improving of chest becoming more tight. Watch pulse ox and goal not to get below 92 percent.    Follow Up Instructions:    I discussed the assessment and treatment plan with the patient. The patient was provided an opportunity to ask questions and all were answered. The patient agreed with the plan and demonstrated an understanding of the instructions.   The patient was advised to call back or seek an in-person evaluation if the symptoms worsen or if the condition fails to improve as anticipated.    Marland Kitchen, PA-C

## 2021-02-26 ENCOUNTER — Other Ambulatory Visit: Payer: Self-pay | Admitting: Physician Assistant

## 2021-02-26 DIAGNOSIS — J329 Chronic sinusitis, unspecified: Secondary | ICD-10-CM

## 2021-02-26 DIAGNOSIS — J40 Bronchitis, not specified as acute or chronic: Secondary | ICD-10-CM

## 2021-03-10 ENCOUNTER — Emergency Department (INDEPENDENT_AMBULATORY_CARE_PROVIDER_SITE_OTHER)
Admission: EM | Admit: 2021-03-10 | Discharge: 2021-03-10 | Disposition: A | Discharge status: Home / Self Care | Payer: BC Managed Care – PPO | Source: Home / Self Care | Attending: Family Medicine | Admitting: Family Medicine

## 2021-03-10 ENCOUNTER — Other Ambulatory Visit: Payer: Self-pay

## 2021-03-10 DIAGNOSIS — G44209 Tension-type headache, unspecified, not intractable: Secondary | ICD-10-CM

## 2021-03-10 MED ORDER — CYCLOBENZAPRINE HCL 5 MG PO TABS: ORAL_TABLET | ORAL | 0 refills | Status: DC

## 2021-03-10 MED ORDER — IBUPROFEN 600 MG PO TABS: 600.0000 mg | ORAL_TABLET | Freq: Four times a day (QID) | ORAL | 0 refills | Status: DC | PRN

## 2021-03-10 NOTE — ED Triage Notes (Addendum)
Pt presents to Urgent Care with c/o headache x 5 days; states it has migrated from posterior head to superior region over several days. Reports pain is worse at night and that she cannot lay down at night. Also fatigued. Reports suspected COVID in July 2022.

## 2021-03-10 NOTE — ED Provider Notes (Signed)
[facility]    CSN: [id number] Arrival date & time: 03/10/21  1529      History   Chief Complaint Chief Complaint  Patient presents with   Headache    HPI Cindy Dunlap is a 30 y.o. female.   HPI  Patient has never been diagnosed with headaches or migraines.  She has headache now for the last 5 days.  She states it goes from the back of her head around to the top of her head.  No visual symptoms.  No scotomata.  No nausea or vomiting.  No light sensitivity or noise sensitivity.  No numbness or weakness in the arms or legs, no injury to head or neck.  No recent infection or sinus symptoms. 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  No increase in stresses recently  Past Medical History:  Diagnosis Date   GERD (gastroesophageal reflux disease)     Patient Active Problem List   Diagnosis Date Noted   Epigastric pain determined by examination 07/29/2018   Nausea 07/29/2018   History of urinary tract infection 07/29/2018   Birth control 01/14/2016   History of anemia 01/14/2016   Family history of cardiac disorder 01/14/2016    Past Surgical History:  Procedure Laterality Date   DILATION AND CURETTAGE OF UTERUS  2015    OB History   No obstetric history on file.      Home Medications    Prior to Admission medications   Medication Sig Start Date End Date Taking? Authorizing Provider  cyclobenzaprine (FLEXERIL) 5 MG tablet Take 1 or 2 at bedtime.  May take during the day if needed for tense muscles.  May cause drowsiness 03/10/21  Yes Eustace Moore, MD  famotidine (PEPCID) 20 MG tablet Take 1 tablet (20 mg total) by mouth 2 (two) times daily. 04/29/20  Yes Tressia Danas, MD  ibuprofen (ADVIL) 600 MG tablet Take 1 tablet (600 mg total) by mouth every 6 (six) hours as needed. 03/10/21  Yes Eustace Moore, MD    Family History Family History  Problem Relation Age of Onset   Atrial fibrillation Mother    Hyperlipidemia  Father    Hypertension Father    Heart attack Father    Lymphoma Maternal Grandmother    Dementia Maternal Grandfather    Diabetes Paternal Grandmother    Stroke Paternal Grandmother    Heart attack Paternal Grandmother    Cancer Paternal Grandfather        LIVER   Heart attack Paternal Grandfather    Colon cancer Maternal Uncle    Stomach cancer Paternal Uncle    Esophageal cancer Paternal Uncle    Pancreatic cancer Neg Hx    Rectal cancer Neg Hx     Social History Social History   Tobacco Use   Smoking status: Never   Smokeless tobacco: Never  Vaping Use   Vaping Use: Never used  Substance Use Topics   Alcohol use: Not Currently   Drug use: Never     Allergies   Ciprofloxacin   Review of Systems Review of Systems See HPI  Physical Exam Triage Vital Signs ED Triage Vitals  Enc Vitals Group     BP 03/10/21 1621 110/73     Pulse Rate 03/10/21 1621 85     Resp 03/10/21 1621 18     Temp 03/10/21 1621 98.7 F (37.1 C)     Temp src --      SpO2 03/10/21  1621 100 %     Weight 03/10/21 1614 135 lb (61.2 kg)     Height 03/10/21 1614 5\' 6"  (1.676 m)     Head Circumference --      Peak Flow --      Pain Score 03/10/21 1614 5     Pain Loc --      Pain Edu? --      Excl. in GC? --    No data found.  Updated Vital Signs BP 110/73   Pulse 85   Temp 98.7 F (37.1 C)   Resp 18   Ht 5\' 6"  (1.676 m)   Wt 61.2 kg   LMP 02/10/2021 (Approximate)   SpO2 100%   BMI 21.79 kg/m      Physical Exam Constitutional:      General: She is not in acute distress.    Appearance: She is well-developed and normal weight.  HENT:     Head: Normocephalic and atraumatic.  Eyes:     Extraocular Movements: Extraocular movements intact.     Conjunctiva/sclera: Conjunctivae normal.     Pupils: Pupils are equal, round, and reactive to light.  Neck:     Comments: Tenderness to palpation of the paraspinous cervical muscles and upper body of trapezius Cardiovascular:     Rate  and Rhythm: Normal rate and regular rhythm.  Pulmonary:     Effort: Pulmonary effort is normal. No respiratory distress.     Breath sounds: Normal breath sounds.  Abdominal:     General: There is no distension.     Palpations: Abdomen is soft.  Musculoskeletal:        General: Normal range of motion.     Cervical back: Normal range of motion.  Skin:    General: Skin is warm and dry.  Neurological:     Mental Status: She is alert and oriented to person, place, and time. Mental status is at baseline.     Cranial Nerves: No dysarthria or facial asymmetry.     Motor: No weakness.     Coordination: Coordination normal.     Gait: Gait normal.     Deep Tendon Reflexes: Reflexes normal.  Psychiatric:        Mood and Affect: Mood normal.        Behavior: Behavior normal.     UC Treatments / Results  Labs (all labs ordered are listed, but only abnormal results are displayed) Labs Reviewed - No data to display  EKG   Radiology No results found.  Procedures Procedures (including critical care time)  Medications Ordered in UC Medications - No data to display  Initial Impression / Assessment and Plan / UC Course  I have reviewed the triage vital signs and the nursing notes.  Pertinent labs & imaging results that were available during my care of the patient were reviewed by me and considered in my medical decision making (see chart for details).     Discussed muscle tension headache.  Reassured patient.  We will treat conservatively. Final Clinical Impressions(s) / UC Diagnoses   Final diagnoses:  Muscle tension headache     Discharge Instructions      May take ibuprofen 3 times a day with food.  This is for headache pain. Take cyclobenzaprine an hour before bed.  This is a muscle relaxer.  Likely will cause drowsiness. Ice or heat to painful neck muscles, what ever brings relief Follow-up with Dr.   ED Prescriptions     Medication  Sig Dispense Auth.  Provider   ibuprofen (ADVIL) 600 MG tablet Take 1 tablet (600 mg total) by mouth every 6 (six) hours as needed. 30 tablet Eustace Moore, MD   cyclobenzaprine (FLEXERIL) 5 MG tablet Take 1 or 2 at bedtime.  May take during the day if needed for tense muscles.  May cause drowsiness 30 tablet Eustace Moore, MD      PDMP not reviewed this encounter.   Eustace Moore, MD 03/10/21 2013

## 2021-03-10 NOTE — Discharge Instructions (Addendum)
May take ibuprofen 3 times a day with food.  This is for headache pain. Take cyclobenzaprine an hour before bed.  This is a muscle relaxer.  Likely will cause drowsiness. Ice or heat to painful neck muscles, what ever brings relief Follow-up with Dr. Lyn Hollingshead

## 2021-04-21 DIAGNOSIS — Z6826 Body mass index (BMI) 26.0-26.9, adult: Secondary | ICD-10-CM | POA: Diagnosis not present

## 2021-04-21 DIAGNOSIS — Z01419 Encounter for gynecological examination (general) (routine) without abnormal findings: Secondary | ICD-10-CM | POA: Diagnosis not present

## 2021-04-21 DIAGNOSIS — Z124 Encounter for screening for malignant neoplasm of cervix: Secondary | ICD-10-CM | POA: Diagnosis not present

## 2021-05-24 ENCOUNTER — Emergency Department (INDEPENDENT_AMBULATORY_CARE_PROVIDER_SITE_OTHER)
Admission: EM | Admit: 2021-05-24 | Discharge: 2021-05-24 | Disposition: A | Discharge status: Home / Self Care | Payer: BC Managed Care – PPO | Source: Home / Self Care

## 2021-05-24 ENCOUNTER — Encounter: Payer: Self-pay | Admitting: Emergency Medicine

## 2021-05-24 DIAGNOSIS — J029 Acute pharyngitis, unspecified: Secondary | ICD-10-CM | POA: Diagnosis not present

## 2021-05-24 DIAGNOSIS — Z20818 Contact with and (suspected) exposure to other bacterial communicable diseases: Secondary | ICD-10-CM

## 2021-05-24 LAB — POCT RAPID STREP A (OFFICE): Rapid Strep A Screen: NEGATIVE

## 2021-05-24 MED ORDER — AMOXICILLIN 875 MG PO TABS: 875.0000 mg | ORAL_TABLET | Freq: Two times a day (BID) | ORAL | 0 refills | Status: AC

## 2021-05-24 NOTE — ED Provider Notes (Signed)
[facility]    CSN: [id number] Arrival date & time: 05/24/21  1507      History   Chief Complaint Chief Complaint  Patient presents with   Sore Throat    HPI Cindy Dunlap is a 30 y.o. female.   HPI  3 of the 4 children have strep throat.  Patient has had sore throat headache and foggy feeling since yesterday.  No sweats chills or fever.  Concerned about strep.  Past Medical History:  Diagnosis Date   GERD (gastroesophageal reflux disease)     Patient Active Problem List   Diagnosis Date Noted   Epigastric pain determined by examination 07/29/2018   Nausea 07/29/2018   History of urinary tract infection 07/29/2018   Birth control 01/14/2016   History of anemia 01/14/2016   Family history of cardiac disorder 01/14/2016    Past Surgical History:  Procedure Laterality Date   DILATION AND CURETTAGE OF UTERUS  2015    OB History   No obstetric history on file.      Home Medications    Prior to Admission medications   Medication Sig Start Date End Date Taking? Authorizing Provider  amoxicillin (AMOXIL) 875 MG tablet Take 1 tablet (875 mg total) by mouth 2 (two) times daily for 10 days. 05/24/21 06/03/21 Yes Eustace Moore, MD  cyclobenzaprine (FLEXERIL) 5 MG tablet Take 1 or 2 at bedtime.  May take during the day if needed for tense muscles.  May cause drowsiness 03/10/21   Eustace Moore, MD  famotidine (PEPCID) 20 MG tablet Take 1 tablet (20 mg total) by mouth 2 (two) times daily. 04/29/20   Tressia Danas, MD  ibuprofen (ADVIL) 600 MG tablet Take 1 tablet (600 mg total) by mouth every 6 (six) hours as needed. 03/10/21   Eustace Moore, MD    Family History Family History  Problem Relation Age of Onset   Atrial fibrillation Mother    Hyperlipidemia Father    Hypertension Father    Heart attack Father    Lymphoma Maternal Grandmother    Dementia Maternal Grandfather    Diabetes Paternal Grandmother    Stroke Paternal Grandmother     Heart attack Paternal Grandmother    Cancer Paternal Grandfather        LIVER   Heart attack Paternal Grandfather    Colon cancer Maternal Uncle    Stomach cancer Paternal Uncle    Esophageal cancer Paternal Uncle    Pancreatic cancer Neg Hx    Rectal cancer Neg Hx     Social History Social History   Tobacco Use   Smoking status: Never   Smokeless tobacco: Never  Vaping Use   Vaping Use: Never used  Substance Use Topics   Alcohol use: Not Currently   Drug use: Never     Allergies   Ciprofloxacin   Review of Systems Review of Systems See HPI  Physical Exam Triage Vital Signs ED Triage Vitals  Enc Vitals Group     BP 05/24/21 1610 115/79     Pulse Rate 05/24/21 1610 76     Resp 05/24/21 1610 18     Temp 05/24/21 1610 98.1 F (36.7 C)     Temp Source 05/24/21 1610 Oral     SpO2 05/24/21 1610 100 %     Weight 05/24/21 1611 150 lb (68 kg)     Height 05/24/21 1611 5\' 6"  (1.676 m)     Head Circumference --  Peak Flow --      Pain Score 05/24/21 1611 1     Pain Loc --      Pain Edu? --      Excl. in GC? --    No data found.  Updated Vital Signs BP 115/79 (BP Location: Right Arm)   Pulse 76   Temp 98.1 F (36.7 C) (Oral)   Resp 18   Ht 5\' 6"  (1.676 m)   Wt 68 kg   LMP 05/12/2021   SpO2 100%   BMI 24.21 kg/m      Physical Exam Constitutional:      General: She is not in acute distress.    Appearance: She is well-developed and normal weight.  HENT:     Head: Normocephalic and atraumatic.     Right Ear: Tympanic membrane and ear canal normal.     Left Ear: Tympanic membrane and ear canal normal.     Nose: Nose normal. No congestion.     Mouth/Throat:     Pharynx: Posterior oropharyngeal erythema present.  Eyes:     Conjunctiva/sclera: Conjunctivae normal.     Pupils: Pupils are equal, round, and reactive to light.  Cardiovascular:     Rate and Rhythm: Normal rate and regular rhythm.     Heart sounds: Normal heart sounds.  Pulmonary:      Effort: Pulmonary effort is normal. No respiratory distress.     Breath sounds: Normal breath sounds.  Abdominal:     General: There is no distension.     Palpations: Abdomen is soft.  Musculoskeletal:        General: Normal range of motion.     Cervical back: Normal range of motion.  Lymphadenopathy:     Cervical: No cervical adenopathy.  Skin:    General: Skin is warm and dry.  Neurological:     Mental Status: She is alert.     UC Treatments / Results  Labs (all labs ordered are listed, but only abnormal results are displayed) Labs Reviewed  CULTURE, GROUP A STREP  POCT RAPID STREP A (OFFICE)    EKG   Radiology No results found.  Procedures Procedures (including critical care time)  Medications Ordered in UC Medications - No data to display  Initial Impression / Assessment and Plan / UC Course  I have reviewed the triage vital signs and the nursing notes.  Pertinent labs & imaging results that were available during my care of the patient were reviewed by me and considered in my medical decision making (see chart for details).     With 4 children positive for strep, and both parents sick I feel it prudent to treat the family for strep throat. Final Clinical Impressions(s) / UC Diagnoses   Final diagnoses:  Sore throat  Strep throat exposure     Discharge Instructions      Amoxicillin 2 times a day for 10 days May take Tylenol, Advil or Aleve for pain Drink lots of water   ED Prescriptions     Medication Sig Dispense Auth. Provider   amoxicillin (AMOXIL) 875 MG tablet Take 1 tablet (875 mg total) by mouth 2 (two) times daily for 10 days. 20 tablet 05/14/2021, MD      PDMP not reviewed this encounter.   Eustace Moore, MD 05/24/21 [phone]

## 2021-05-24 NOTE — Discharge Instructions (Signed)
Amoxicillin 2 times a day for 10 days May take Tylenol, Advil or Aleve for pain Drink lots of water

## 2021-05-24 NOTE — ED Triage Notes (Signed)
Patient c/o sore throat, brain fog, headache and congestion since last night.  Kids all tested positive for strep throat.  Patient is not vaccinated for COVID.

## 2021-05-27 ENCOUNTER — Other Ambulatory Visit: Payer: Self-pay

## 2021-05-27 ENCOUNTER — Encounter: Payer: Self-pay | Admitting: Physician Assistant

## 2021-05-27 ENCOUNTER — Ambulatory Visit (INDEPENDENT_AMBULATORY_CARE_PROVIDER_SITE_OTHER): Payer: BC Managed Care – PPO | Admitting: Physician Assistant

## 2021-05-27 VITALS — BP 109/88 | HR 98 | Temp 98.3°F | Ht 66.0 in | Wt 155.0 lb

## 2021-05-27 DIAGNOSIS — Z87442 Personal history of urinary calculi: Secondary | ICD-10-CM | POA: Diagnosis not present

## 2021-05-27 DIAGNOSIS — R109 Unspecified abdominal pain: Secondary | ICD-10-CM

## 2021-05-27 LAB — POCT URINALYSIS DIP (CLINITEK)
Bilirubin, UA: NEGATIVE
Blood, UA: NEGATIVE
Glucose, UA: NEGATIVE mg/dL
Leukocytes, UA: NEGATIVE
Nitrite, UA: NEGATIVE
POC PROTEIN,UA: NEGATIVE
Spec Grav, UA: 1.02 (ref 1.010–1.025)
Urobilinogen, UA: 2 E.U./dL — AB
pH, UA: 6.5 (ref 5.0–8.0)

## 2021-05-27 LAB — CULTURE, GROUP A STREP: Strep A Culture: NEGATIVE

## 2021-05-27 MED ORDER — TAMSULOSIN HCL 0.4 MG PO CAPS
0.4000 mg | ORAL_CAPSULE | Freq: Every day | ORAL | 0 refills | Status: DC
Start: 2021-05-27 — End: 2021-08-11

## 2021-05-27 NOTE — Patient Instructions (Signed)
Kidney Stones ?Kidney stones are solid, rock-like deposits that form inside of the kidneys. The kidneys are a pair of organs that make urine. A kidney stone may form in a kidney and move into other parts of the urinary tract, including the tubes that connect the kidneys to the bladder (ureters), the bladder, and the tube that carries urine out of the body (urethra). As the stone moves through these areas, it can cause intense pain and block the flow of urine. ?Kidney stones are created when high levels of certain minerals are found in the urine. The stones are usually passed out of the body through urination, but in some cases, medical treatment may be needed to remove them. ?What are the causes? ?Kidney stones may be caused by: ?A condition in which certain glands produce too much parathyroid hormone (primary hyperparathyroidism), which causes too much calcium buildup in the blood. ?A buildup of uric acid crystals in the bladder (hyperuricosuria). Uric acid is a chemical that the body produces when you eat certain foods. It usually exits the body in the urine. ?Narrowing (stricture) of one or both of the ureters. ?A kidney blockage that is present at birth (congenital obstruction). ?Past surgery on the kidney or the ureters, such as gastric bypass surgery. ?What increases the risk? ?The following factors may make you more likely to develop this condition: ?Having had a kidney stone in the past. ?Having a family history of kidney stones. ?Not drinking enough water. ?Eating a diet that is high in protein, salt (sodium), or sugar. ?Being overweight or obese. ?What are the signs or symptoms? ?Symptoms of a kidney stone may include: ?Pain in the side of the abdomen, right below the ribs (flank pain). Pain usually spreads (radiates) to the groin. ?Needing to urinate frequently or urgently. ?Painful urination. ?Blood in the urine (hematuria). ?Nausea. ?Vomiting. ?Fever and chills. ?How is this diagnosed? ?This condition  may be diagnosed based on: ?Your symptoms and medical history. ?A physical exam. ?Blood tests. ?Urine tests. These may be done before and after the stone passes out of your body through urination. ?Imaging tests, such as a CT scan, abdominal X-ray, or ultrasound. ?A procedure to examine the inside of the bladder (cystoscopy). ?How is this treated? ?Treatment for kidney stones depends on the size, location, and makeup of the stones. Kidney stones will often pass out of the body through urination. You may need to: ?Increase your fluid intake to help pass the stone. In some cases, you may be given fluids through an IV and may need to be monitored at the hospital. ?Take medicine for pain. ?Make changes in your diet to help prevent kidney stones from coming back. ?Sometimes, medical procedures are needed to remove a kidney stone. This may involve: ?A procedure to break up kidney stones using: ?A focused beam of light (laser therapy). ?Shock waves (extracorporeal shock wave lithotripsy). ?Surgery to remove kidney stones. This may be needed if you have severe pain or have stones that block your urinary tract. ?Follow these instructions at home: ?Medicines ?Take over-the-counter and prescription medicines only as told by your health care provider. ?Ask your health care provider if the medicine prescribed to you requires you to avoid driving or using heavy machinery. ?Eating and drinking ?Drink enough fluid to keep your urine pale yellow. You may be instructed to drink at least 8-10 glasses of water each day. This will help you pass the kidney stone. ?If directed, change your diet. This may include: ?Limiting how much sodium   you eat. ?Eating more fruits and vegetables. ?Limiting how much animal protein--such as red meat, poultry, fish, and eggs--you eat. ?Follow instructions from your health care provider about eating or drinking restrictions. ?General instructions ?Collect urine samples as told by your health care provider.  You may need to collect a urine sample: ?24 hours after you pass the stone. ?8-12 weeks after passing the kidney stone, and every 6-12 months after that. ?Strain your urine every time you urinate, for as long as directed. Use the strainer that your health care provider recommends. ?Do not throw out the kidney stone after passing it. Keep the stone so it can be tested by your health care provider. Testing the makeup of your kidney stone may help prevent you from getting kidney stones in the future. ?Keep all follow-up visits as told by your health care provider. This is important. You may need follow-up X-rays or ultrasounds to make sure that your stone has passed. ?How is this prevented? ?To prevent another kidney stone: ?Drink enough fluid to keep your urine pale yellow. This is the best way to prevent kidney stones. ?Eat a healthy diet and follow recommendations from your health care provider about foods to avoid. You may be instructed to eat a low-protein diet. Recommendations vary depending on the type of kidney stone that you have. ?Maintain a healthy weight. ?Where to find more information ?National Kidney Foundation (NKF): www.kidney.org ?[facility] ([facility]): www.urologyhealth.org ?Contact a health care provider if: ?You have pain that gets worse or does not get better with medicine. ?Get help right away if: ?You have a fever or chills. ?You develop severe pain. ?You develop new abdominal pain. ?You faint. ?You are unable to urinate. ?Summary ?Kidney stones are solid, rock-like deposits that form inside of the kidneys. ?Kidney stones can cause nausea, vomiting, blood in the urine, abdominal pain, and the urge to urinate frequently. ?Treatment for kidney stones depends on the size, location, and makeup of the stones. Kidney stones will often pass out of the body through urination. ?Kidney stones can be prevented by drinking enough fluids, eating a healthy diet, and maintaining a healthy weight. ?This  information is not intended to replace advice given to you by your health care provider. Make sure you discuss any questions you have with your health care provider. ?Document Revised: 11/25/2018 Document Reviewed: 11/29/2018 ?Elsevier Patient Education ? 2022 [employer] Inc. ? ?

## 2021-05-27 NOTE — Progress Notes (Signed)
Subjective:    Patient ID: Cindy Dunlap, female    DOB: 1991-04-10, 30 y.o.   MRN: [id number]  HPI Pt is a 30 yo female who presents to the clinic with right flank pain for 2 days that was severe last night. Pt has hx of kidney stones. She has not had any pain today. She took ibuprofen and tylenol last night. She is on amoxicillin for strep throat. No fever, chills, body aches, or vomiting. Last kidney stone was 2 years ago and she passed it on her own.   .. Active Ambulatory Problems    Diagnosis Date Noted   Birth control 01/14/2016   History of anemia 01/14/2016   Family history of cardiac disorder 01/14/2016   Epigastric pain determined by examination 07/29/2018   Nausea 07/29/2018   History of urinary tract infection 07/29/2018   Resolved Ambulatory Problems    Diagnosis Date Noted   Annual physical exam 01/14/2016   Influenza-like illness 08/28/2016   Hearing loss due to cerumen impaction, left 08/28/2016   Past Medical History:  Diagnosis Date   GERD (gastroesophageal reflux disease)      Review of Systems See HPI.     Objective:   Physical Exam Vitals reviewed.  Constitutional:      Appearance: Normal appearance.  HENT:     Head: Normocephalic.  Cardiovascular:     Rate and Rhythm: Normal rate and regular rhythm.     Pulses: Normal pulses.     Heart sounds: Normal heart sounds.  Pulmonary:     Effort: Pulmonary effort is normal.  Abdominal:     General: Bowel sounds are normal. There is no distension.     Palpations: Abdomen is soft. There is no mass.     Tenderness: There is no abdominal tenderness. There is no right CVA tenderness, left CVA tenderness, guarding or rebound.     Hernia: No hernia is present.  Musculoskeletal:     Right lower leg: No edema.     Left lower leg: No edema.  Neurological:     General: No focal deficit present.     Mental Status: She is alert and oriented to person, place, and time.  Psychiatric:        Mood and Affect:  Mood normal.    .. Results for orders placed or performed in visit on 05/27/21  CBC with Differential/Platelet  Result Value Ref Range   WBC 4.0 3.8 - 10.8 Thousand/uL   RBC 4.25 3.80 - 5.10 Million/uL   Hemoglobin 13.1 11.7 - 15.5 g/dL   HCT 38.8 35.0 - 45.0 %   MCV 91.3 80.0 - 100.0 fL   MCH 30.8 27.0 - 33.0 pg   MCHC 33.8 32.0 - 36.0 g/dL   RDW 11.8 11.0 - 15.0 %   Platelets 192 140 - 400 Thousand/uL   MPV 11.2 7.5 - 12.5 fL   Neutro Abs 2,384 1,500 - 7,800 cells/uL   Lymphs Abs 1,136 850 - 3,900 cells/uL   Absolute Monocytes 440 200 - 950 cells/uL   Eosinophils Absolute 20 15 - 500 cells/uL   Basophils Absolute 20 0 - 200 cells/uL   Neutrophils Relative % 59.6 %   Total Lymphocyte 28.4 %   Monocytes Relative 11.0 %   Eosinophils Relative 0.5 %   Basophils Relative 0.5 %  COMPLETE METABOLIC PANEL WITH GFR  Result Value Ref Range   Glucose, Bld 94 65 - 99 mg/dL   BUN 10 7 - 25 mg/dL  Creat 0.80 0.50 - 0.97 mg/dL   eGFR 102 > OR = 60 mL/min/1.60m   BUN/Creatinine Ratio NOT APPLICABLE 6 - 22 (calc)   Sodium 139 135 - 146 mmol/L   Potassium 4.0 3.5 - 5.3 mmol/L   Chloride 104 98 - 110 mmol/L   CO2 30 20 - 32 mmol/L   Calcium 9.3 8.6 - 10.2 mg/dL   Total Protein 7.0 6.1 - 8.1 g/dL   Albumin 4.5 3.6 - 5.1 g/dL   Globulin 2.5 1.9 - 3.7 g/dL (calc)   AG Ratio 1.8 1.0 - 2.5 (calc)   Total Bilirubin 0.5 0.2 - 1.2 mg/dL   Alkaline phosphatase (APISO) 55 31 - 125 U/L   AST 13 10 - 30 U/L   ALT 8 6 - 29 U/L  POCT URINALYSIS DIP (CLINITEK)  Result Value Ref Range   Color, UA yellow yellow   Clarity, UA clear clear   Glucose, UA negative negative mg/dL   Bilirubin, UA negative negative   Ketones, POC UA trace (5) (A) negative mg/dL   Spec Grav, UA 1.020 1.010 - 1.025   Blood, UA negative negative   pH, UA 6.5 5.0 - 8.0   POC PROTEIN,UA negative negative, trace   Urobilinogen, UA 2.0 (A) 0.2 or 1.0 E.U./dL   Nitrite, UA Negative Negative   Leukocytes, UA Negative  Negative         Assessment & Plan:  .HKensleiwas seen today for flank pain.  Diagnoses and all orders for this visit:  Right flank pain -     POCT URINALYSIS DIP (CLINITEK) -     Urine Culture -     tamsulosin (FLOMAX) 0.4 MG CAPS capsule; Take 1 capsule (0.4 mg total) by mouth daily after supper. -     CBC with Differential/Platelet -     COMPLETE METABOLIC PANEL WITH GFR  History of kidney stones -     tamsulosin (FLOMAX) 0.4 MG CAPS capsule; Take 1 capsule (0.4 mg total) by mouth daily after supper. -     CBC with Differential/Platelet -     COMPLETE METABOLIC PANEL WITH GFR  Suspect right kidney stone. Pain has resolved today.  Will hold on any imaging.  UA was negative for any blood, leuks, or nitrites.  Will culture.  She is on amoxicillin for strep throat.  CBC/CMP ordered.  Start flomax for 15 days.  Discussed pain control as needed if right flank pain starts back.  Reassured patient no red flags on todays exam.  Follow up as needed.

## 2021-05-28 LAB — COMPLETE METABOLIC PANEL WITH GFR
AG Ratio: 1.8 (calc) (ref 1.0–2.5)
ALT: 8 U/L (ref 6–29)
AST: 13 U/L (ref 10–30)
Albumin: 4.5 g/dL (ref 3.6–5.1)
Alkaline phosphatase (APISO): 55 U/L (ref 31–125)
BUN: 10 mg/dL (ref 7–25)
CO2: 30 mmol/L (ref 20–32)
Calcium: 9.3 mg/dL (ref 8.6–10.2)
Chloride: 104 mmol/L (ref 98–110)
Creat: 0.8 mg/dL (ref 0.50–0.97)
Globulin: 2.5 g/dL (calc) (ref 1.9–3.7)
Glucose, Bld: 94 mg/dL (ref 65–99)
Potassium: 4 mmol/L (ref 3.5–5.3)
Sodium: 139 mmol/L (ref 135–146)
Total Bilirubin: 0.5 mg/dL (ref 0.2–1.2)
Total Protein: 7 g/dL (ref 6.1–8.1)
eGFR: 102 mL/min/{1.73_m2} (ref >=60)

## 2021-05-28 LAB — CBC WITH DIFFERENTIAL/PLATELET
Absolute Monocytes: 440 cells/uL (ref 200–950)
Basophils Absolute: 20 cells/uL (ref 0–200)
Basophils Relative: 0.5 %
Eosinophils Absolute: 20 cells/uL (ref 15–500)
Eosinophils Relative: 0.5 %
HCT: 38.8 % (ref 35.0–45.0)
Hemoglobin: 13.1 g/dL (ref 11.7–15.5)
Lymphs Abs: 1136 cells/uL (ref 850–3900)
MCH: 30.8 pg (ref 27.0–33.0)
MCHC: 33.8 g/dL (ref 32.0–36.0)
MCV: 91.3 fL (ref 80.0–100.0)
MPV: 11.2 fL (ref 7.5–12.5)
Monocytes Relative: 11 %
Neutro Abs: 2384 cells/uL (ref 1500–7800)
Neutrophils Relative %: 59.6 %
Platelets: 192 10*3/uL (ref 140–400)
RBC: 4.25 10*6/uL (ref 3.80–5.10)
RDW: 11.8 % (ref 11.0–15.0)
Total Lymphocyte: 28.4 %
WBC: 4 10*3/uL (ref 3.8–10.8)

## 2021-05-28 NOTE — Progress Notes (Signed)
Normal CBC.  Great kidney, liver, glucose.  How are you feeling today?

## 2021-05-29 LAB — URINE CULTURE
MICRO NUMBER:: 12581358
Result:: NO GROWTH
SPECIMEN QUALITY:: ADEQUATE

## 2021-05-29 NOTE — Progress Notes (Signed)
No bacteria growth on urine culture. How are you feeling?

## 2021-06-03 ENCOUNTER — Other Ambulatory Visit: Payer: Self-pay | Admitting: Physician Assistant

## 2021-06-03 DIAGNOSIS — R109 Unspecified abdominal pain: Secondary | ICD-10-CM

## 2021-06-03 DIAGNOSIS — Z87442 Personal history of urinary calculi: Secondary | ICD-10-CM

## 2021-06-11 ENCOUNTER — Telehealth: Payer: Self-pay | Admitting: Physician Assistant

## 2021-06-11 NOTE — Telephone Encounter (Signed)
Patient dropped off a Wellness form she needs to turn into her insurance. She did have a physical in March 2022 with Dr. Lyn Hollingshead. She was informed of a possible $29 fee and 3-5 day turn around. Paperwork left in covering providers basket. Tandy Gaw)

## 2021-06-17 NOTE — Telephone Encounter (Signed)
Call to patient to let her know the form was complete and ready for pick up.   VM full; unable to leave msg.

## 2021-06-17 NOTE — Telephone Encounter (Signed)
Call to patient to let her know the form was complete and ready for pick up.    VM full; unable to leave Shauna Hugh, CMA

## 2021-06-18 NOTE — Telephone Encounter (Signed)
Call to patient to let her know the form was complete and ready for pick up.    VM full; unable to leave msg.  Three attempts have been made to contact pt unsuccessfully.  Encounter closed per protocol.  Tiajuana Amass, CMA

## 2021-08-11 ENCOUNTER — Encounter: Payer: Self-pay | Admitting: Physician Assistant

## 2021-08-11 ENCOUNTER — Ambulatory Visit (INDEPENDENT_AMBULATORY_CARE_PROVIDER_SITE_OTHER): Payer: BC Managed Care – PPO | Admitting: Physician Assistant

## 2021-08-11 ENCOUNTER — Other Ambulatory Visit: Payer: Self-pay

## 2021-08-11 VITALS — BP 128/76 | HR 90 | Ht 66.0 in | Wt 156.0 lb

## 2021-08-11 DIAGNOSIS — F419 Anxiety disorder, unspecified: Secondary | ICD-10-CM | POA: Diagnosis not present

## 2021-08-11 DIAGNOSIS — R5383 Other fatigue: Secondary | ICD-10-CM

## 2021-08-11 DIAGNOSIS — R0602 Shortness of breath: Secondary | ICD-10-CM

## 2021-08-11 DIAGNOSIS — R0789 Other chest pain: Secondary | ICD-10-CM | POA: Diagnosis not present

## 2021-08-11 MED ORDER — OMEPRAZOLE 40 MG PO CPDR
40.0000 mg | DELAYED_RELEASE_CAPSULE | Freq: Every day | ORAL | 2 refills | Status: DC
Start: 2021-08-11 — End: 2021-11-03

## 2021-08-11 MED ORDER — HYDROXYZINE HCL 10 MG PO TABS: 10.0000 mg | ORAL_TABLET | Freq: Three times a day (TID) | ORAL | 0 refills | Status: DC | PRN

## 2021-08-11 NOTE — Patient Instructions (Signed)
Start omeprazole daily for 4-6 weeks.  Start as needed hydroxyzine for anxiety as needed.  Follow up 4 weeks.

## 2021-08-11 NOTE — Progress Notes (Signed)
Subjective:    Patient ID: Cindy Dunlap, female    DOB: Jun 07, 1991, 31 y.o.   MRN: [id number]  HPI Pt is a 31 yo female who presents to the clinic to discuss some chest pain, tightness, anxiety, mid back pain this week. She has been really anxious this week because her brothers newborn baby was born early and not doing well. Hx of anxiety and tried OTC calm aid and ashwaganda but no help. Not tried any medication. CP not associated with exertion. Seems like does worsen at night when she lays down. Some reflux. Pepcid does seem to help. Her family history has a strong cardiac history and this worries her.    .. Family History  Problem Relation Age of Onset   Atrial fibrillation Mother    Hyperlipidemia Father    Hypertension Father    Heart attack Father    Lymphoma Maternal Grandmother    Dementia Maternal Grandfather    Diabetes Paternal Grandmother    Stroke Paternal Grandmother    Heart attack Paternal Grandmother    Cancer Paternal Grandfather        LIVER   Heart attack Paternal Grandfather    Colon cancer Maternal Uncle    Stomach cancer Paternal Uncle    Esophageal cancer Paternal Uncle    Pancreatic cancer Neg Hx    Rectal cancer Neg Hx      Review of Systems See HPI.     Objective:   Physical Exam Vitals reviewed.  Constitutional:      Appearance: Normal appearance.  HENT:     Head: Normocephalic.  Neck:     Vascular: No carotid bruit.  Cardiovascular:     Rate and Rhythm: Normal rate and regular rhythm.     Pulses: Normal pulses.     Heart sounds: Normal heart sounds.  Pulmonary:     Effort: Pulmonary effort is normal.     Breath sounds: Normal breath sounds.  Abdominal:     General: Bowel sounds are normal.     Palpations: Abdomen is soft.     Tenderness: There is no right CVA tenderness or left CVA tenderness.  Musculoskeletal:     Cervical back: Normal range of motion and neck supple.     Right lower leg: No edema.     Left lower leg: No edema.   Lymphadenopathy:     Cervical: No cervical adenopathy.  Neurological:     General: No focal deficit present.     Mental Status: She is alert and oriented to person, place, and time.  Psychiatric:        Mood and Affect: Mood normal.   .. Depression screen [facility] 2/9 08/11/2021 10/07/2020 10/07/2020 02/26/2020 10/26/2019  Decreased Interest 0 0 0 0 0  Down, Depressed, Hopeless 0 0 0 0 0  PHQ - 2 Score 0 0 0 0 0  Altered sleeping 3 0 - 1 0  Tired, decreased energy 1 0 - 1 1  Change in appetite 1 0 - 0 1  Feeling bad or failure about yourself  0 - - 0 0  Trouble concentrating 1 0 - 0 1  Moving slowly or fidgety/restless 1 0 - 0 0  Suicidal thoughts 0 0 - 0 0  PHQ-9 Score 7 0 - 2 3  Difficult doing work/chores Somewhat difficult Not difficult at all - Not difficult at all Not difficult at all   .Marland Kitchen GAD 7 : Generalized Anxiety Score 08/11/2021 02/26/2020 10/26/2019 04/13/2016  Nervous, Anxious,  on Edge 1 0 1 1  Control/stop worrying 1 1 0 1  Worry too much - different things 1 0 1 1  Trouble relaxing 1 0 1 1  Restless 1 0 0 1  Easily annoyed or irritable 0 0 0 1  Afraid - awful might happen 1 0 0 1  Total GAD 7 Score 6 1 3 7   Anxiety Difficulty Somewhat difficult Not difficult at all Not difficult at all Somewhat difficult       EKG- NSR at 69. No ST elevation or Depression.     Assessment & Plan:  Marland KitchenAnabeth was seen today for back pain.  Diagnoses and all orders for this visit:  Anxiety -     CBC with Differential/Platelet -     COMPLETE METABOLIC PANEL WITH GFR -     TSH -     Fe+TIBC+Fer -     Lipase -     H. pylori breath test -     hydrOXYzine (ATARAX) 10 MG tablet; Take 1 tablet (10 mg total) by mouth 3 (three) times daily as needed for anxiety.  No energy -     CBC with Differential/Platelet -     COMPLETE METABOLIC PANEL WITH GFR -     TSH -     Fe+TIBC+Fer -     H. pylori breath test  Shortness of breath -     CBC with Differential/Platelet -     COMPLETE METABOLIC  PANEL WITH GFR -     TSH -     Fe+TIBC+Fer -     Lipase -     H. pylori breath test -     EKG 12-Lead  Atypical chest pain -     CBC with Differential/Platelet -     COMPLETE METABOLIC PANEL WITH GFR -     TSH -     Fe+TIBC+Fer -     omeprazole (PRILOSEC) 40 MG capsule; Take 1 capsule (40 mg total) by mouth daily. -     Lipase -     H. pylori breath test -     EKG 12-Lead   Reassurance EKG looks good.  ? GERD ? Anxiety Get labs Start omeprazole for 4-6 weeks Start hydroxyzine for as needed anxiety/CP Consider daily SSRI. Declined today. Call if symptoms worsen or change Follow up in 4 weeks.

## 2021-08-12 LAB — CBC WITH DIFFERENTIAL/PLATELET
Absolute Monocytes: 461 cells/uL (ref 200–950)
Basophils Absolute: 9 cells/uL (ref 0–200)
Basophils Relative: 0.2 %
Eosinophils Absolute: 19 cells/uL (ref 15–500)
Eosinophils Relative: 0.4 %
HCT: 42.3 % (ref 35.0–45.0)
Hemoglobin: 14.2 g/dL (ref 11.7–15.5)
Lymphs Abs: 1321 cells/uL (ref 850–3900)
MCH: 30.4 pg (ref 27.0–33.0)
MCHC: 33.6 g/dL (ref 32.0–36.0)
MCV: 90.6 fL (ref 80.0–100.0)
MPV: 12 fL (ref 7.5–12.5)
Monocytes Relative: 9.8 %
Neutro Abs: 2891 cells/uL (ref 1500–7800)
Neutrophils Relative %: 61.5 %
Platelets: 194 10*3/uL (ref 140–400)
RBC: 4.67 10*6/uL (ref 3.80–5.10)
RDW: 12 % (ref 11.0–15.0)
Total Lymphocyte: 28.1 %
WBC: 4.7 10*3/uL (ref 3.8–10.8)

## 2021-08-12 LAB — COMPLETE METABOLIC PANEL WITH GFR
AG Ratio: 1.8 (calc) (ref 1.0–2.5)
ALT: 6 U/L (ref 6–29)
AST: 11 U/L (ref 10–30)
Albumin: 4.9 g/dL (ref 3.6–5.1)
Alkaline phosphatase (APISO): 56 U/L (ref 31–125)
BUN: 11 mg/dL (ref 7–25)
CO2: 30 mmol/L (ref 20–32)
Calcium: 9.4 mg/dL (ref 8.6–10.2)
Chloride: 105 mmol/L (ref 98–110)
Creat: 0.88 mg/dL (ref 0.50–0.97)
Globulin: 2.8 g/dL (calc) (ref 1.9–3.7)
Glucose, Bld: 90 mg/dL (ref 65–99)
Potassium: 3.7 mmol/L (ref 3.5–5.3)
Sodium: 140 mmol/L (ref 135–146)
Total Bilirubin: 0.8 mg/dL (ref 0.2–1.2)
Total Protein: 7.7 g/dL (ref 6.1–8.1)
eGFR: 91 mL/min/{1.73_m2} (ref >=60)

## 2021-08-12 LAB — IRON,TIBC AND FERRITIN PANEL
%SAT: 32 % (calc) (ref 16–45)
Ferritin: 26 ng/mL (ref 16–154)
Iron: 116 ug/dL (ref 40–190)
TIBC: 361 mcg/dL (calc) (ref 250–450)

## 2021-08-12 LAB — LIPASE: Lipase: 21 U/L (ref 7–60)

## 2021-08-12 LAB — TSH: TSH: 1.33 mIU/L

## 2021-08-12 NOTE — Progress Notes (Signed)
Cindy Dunlap,   Normal hemoglobin.  Kidney, liver, glucose look GREAT.  Thyroid is wonderful.  Normal pancreatic enzymes.  Labs look great.

## 2021-10-17 ENCOUNTER — Emergency Department (INDEPENDENT_AMBULATORY_CARE_PROVIDER_SITE_OTHER)
Admission: EM | Admit: 2021-10-17 | Discharge: 2021-10-17 | Disposition: A | Discharge status: Home / Self Care | Payer: BC Managed Care – PPO | Source: Home / Self Care

## 2021-10-17 DIAGNOSIS — R519 Headache, unspecified: Secondary | ICD-10-CM

## 2021-10-17 DIAGNOSIS — R1011 Right upper quadrant pain: Secondary | ICD-10-CM

## 2021-10-17 NOTE — ED Triage Notes (Signed)
Pt c/o headache that started last night. Also c/o RT sided abd pain that started last night as well. Pain radiates down towards center. Hx of acid reflux and anxiety. Pain is intermittent and currently a 3/10. Denies fever and N/V/D.  ?

## 2021-10-17 NOTE — ED Provider Notes (Signed)
?[facility] ? ? ? ?CSN: [id number] ?Arrival date & time: 10/17/21  1510 ? ? ?  ? ?History   ?Chief Complaint ?Chief Complaint  ?Patient presents with  ? Abdominal Pain  ? Headache  ? ? ?HPI ?Cindy Dunlap is a 31 y.o. female.  ? ?HPI 31 year old female presents with a headache which started last night patient has not taken anything for this headache as she does not like taking medications..  Patient reports right-sided abdominal pain that started last night as well.  Patient reports pain radiates down towards center.  PMH significant for GERD and UTI. ? ?Past Medical History:  ?Diagnosis Date  ? GERD (gastroesophageal reflux disease)   ? ? ?Patient Active Problem List  ? Diagnosis Date Noted  ? Epigastric pain determined by examination 07/29/2018  ? Nausea 07/29/2018  ? History of urinary tract infection 07/29/2018  ? Birth control 01/14/2016  ? History of anemia 01/14/2016  ? Family history of cardiac disorder 01/14/2016  ? ? ?Past Surgical History:  ?Procedure Laterality Date  ? DILATION AND CURETTAGE OF UTERUS  2015  ? ? ?OB History   ?No obstetric history on file. ?  ? ? ? ?Home Medications   ? ?Prior to Admission medications   ?Medication Sig Start Date End Date Taking? Authorizing Provider  ?cetirizine (ZYRTEC) 10 MG tablet Take 10 mg by mouth daily.   Yes [provider]  ?hydrOXYzine (ATARAX) 10 MG tablet Take 1 tablet (10 mg total) by mouth 3 (three) times daily as needed for anxiety. 08/11/21   Breeback, Lonna Cobb, PA-C  ?omeprazole (PRILOSEC) 40 MG capsule Take 1 capsule (40 mg total) by mouth daily. 08/11/21   Jomarie Longs, PA-C  ? ? ?Family History ?Family History  ?Problem Relation Age of Onset  ? Atrial fibrillation Mother   ? Hyperlipidemia Father   ? Hypertension Father   ? Heart attack Father   ? Lymphoma Maternal Grandmother   ? Dementia Maternal Grandfather   ? Diabetes Paternal Grandmother   ? Stroke Paternal Grandmother   ? Heart attack Paternal Grandmother   ? Cancer  Paternal Grandfather   ?     LIVER  ? Heart attack Paternal Grandfather   ? Colon cancer Maternal Uncle   ? Stomach cancer Paternal Uncle   ? Esophageal cancer Paternal Uncle   ? Pancreatic cancer Neg Hx   ? Rectal cancer Neg Hx   ? ? ?Social History ?Social History  ? ?Tobacco Use  ? Smoking status: Never  ? Smokeless tobacco: Never  ?Vaping Use  ? Vaping Use: Never used  ?Substance Use Topics  ? Alcohol use: Not Currently  ? Drug use: Never  ? ? ? ?Allergies   ?Ciprofloxacin ? ? ?Review of Systems ?Review of Systems  ?Gastrointestinal:  Positive for abdominal pain.  ?Neurological:  Positive for headaches.  ?All other systems reviewed and are negative. ? ? ?Physical Exam ?Triage Vital Signs ?ED Triage Vitals  ?Enc Vitals Group  ?   BP   ?   Pulse   ?   Resp   ?   Temp   ?   Temp src   ?   SpO2   ?   Weight   ?   Height   ?   Head Circumference   ?   Peak Flow   ?   Pain Score   ?   Pain Loc   ?   Pain Edu?   ?  Excl. in GC?   ? ?No data found. ? ?Updated Vital Signs ?BP 124/84 (BP Location: Right Arm)   Pulse 96   Temp 98.1 ?F (36.7 ?C) (Oral)   Resp 18   SpO2 98%  ? ? ?Physical Exam ?Vitals and nursing note reviewed.  ?Constitutional:   ?   General: She is not in acute distress. ?   Appearance: She is well-developed and normal weight.  ?HENT:  ?   Head: Normocephalic and atraumatic.  ?   Mouth/Throat:  ?   Mouth: Mucous membranes are moist.  ?   Pharynx: Oropharynx is clear.  ?Eyes:  ?   Extraocular Movements: Extraocular movements intact.  ?   Pupils: Pupils are equal, round, and reactive to light.  ?Cardiovascular:  ?   Rate and Rhythm: Normal rate and regular rhythm.  ?   Heart sounds: Normal heart sounds. No murmur heard. ?Pulmonary:  ?   Effort: Pulmonary effort is normal.  ?   Breath sounds: Normal breath sounds. No wheezing, rhonchi or rales.  ?Abdominal:  ?   General: Abdomen is flat and scaphoid. Bowel sounds are normal. There is no distension.  ?   Palpations: Abdomen is soft. There is no shifting  dullness, fluid wave, hepatomegaly, splenomegaly, mass or pulsatile mass.  ?   Tenderness: There is abdominal tenderness in the right upper quadrant. There is no right CVA tenderness, left CVA tenderness, guarding or rebound. Negative signs include Murphy's sign, McBurney's sign and psoas sign.  ?   Hernia: No hernia is present.  ?Skin: ?   General: Skin is warm and dry.  ?Neurological:  ?   General: No focal deficit present.  ?   Mental Status: She is alert and oriented to person, place, and time.  ? ? ? ?UC Treatments / Results  ?Labs ?(all labs ordered are listed, but only abnormal results are displayed) ?Labs Reviewed - No data to display ? ?EKG ? ? ?Radiology ?No results found. ? ?Procedures ?Procedures (including critical care time) ? ?Medications Ordered in UC ?Medications - No data to display ? ?Initial Impression / Assessment and Plan / UC Course  ?I have reviewed the triage vital signs and the nursing notes. ? ?Pertinent labs & imaging results that were available during my care of the patient were reviewed by me and considered in my medical decision making (see chart for details). ? ?  ? ?MDM: 1.  Right upper quadrant abdominal pain- Advised patient if abdominal pain worsens and/or unresolved please follow-up with PCP or here for further evaluation. 2. Advised patient may take OTC Excedrin 1-2 times daily for headache pain.  Patient discharged home, hemodynamically stable. ?Final Clinical Impressions(s) / UC Diagnoses  ? ?Final diagnoses:  ?Bad headache  ?Right upper quadrant abdominal pain  ? ? ? ?Discharge Instructions   ? ?  ?Advised patient may take OTC Excedrin 1-2 times daily for headache pain.  Advised patient if abdominal pain worsens and/or unresolved please follow-up with PCP or here for further evaluation. ? ? ? ? ?ED Prescriptions   ?None ?  ? ?PDMP not reviewed this encounter. ?  ?Trevor Iha, FNP ?10/17/21 1613 ? ?

## 2021-10-17 NOTE — Discharge Instructions (Addendum)
Advised patient may take OTC Excedrin 1-2 times daily for headache pain.  Advised patient if abdominal pain worsens and/or unresolved please follow-up with PCP or here for further evaluation. ?

## 2021-10-21 ENCOUNTER — Ambulatory Visit: Payer: BC Managed Care – PPO | Admitting: Family Medicine

## 2021-10-21 NOTE — Progress Notes (Deleted)
? ?Acute Office Visit ? ?Subjective:  ? ? Patient ID: Cindy Dunlap, female    DOB: May 08, 1991, 31 y.o.   MRN: [id number] ? ?No chief complaint on file. ? ? ?HPI ?Patient is in today for RUQ pain -  ? ?Past Medical History:  ?Diagnosis Date  ? GERD (gastroesophageal reflux disease)   ? ? ?Past Surgical History:  ?Procedure Laterality Date  ? DILATION AND CURETTAGE OF UTERUS  2015  ? ? ?Family History  ?Problem Relation Age of Onset  ? Atrial fibrillation Mother   ? Hyperlipidemia Father   ? Hypertension Father   ? Heart attack Father   ? Lymphoma Maternal Grandmother   ? Dementia Maternal Grandfather   ? Diabetes Paternal Grandmother   ? Stroke Paternal Grandmother   ? Heart attack Paternal Grandmother   ? Cancer Paternal Grandfather   ?     LIVER  ? Heart attack Paternal Grandfather   ? Colon cancer Maternal Uncle   ? Stomach cancer Paternal Uncle   ? Esophageal cancer Paternal Uncle   ? Pancreatic cancer Neg Hx   ? Rectal cancer Neg Hx   ? ? ?Social History  ? ?Socioeconomic History  ? Marital status: Married  ?  Spouse name: Not on file  ? Number of children: Not on file  ? Years of education: Not on file  ? Highest education level: Not on file  ?Occupational History  ? Not on file  ?Tobacco Use  ? Smoking status: Never  ? Smokeless tobacco: Never  ?Vaping Use  ? Vaping Use: Never used  ?Substance and Sexual Activity  ? Alcohol use: Not Currently  ? Drug use: Never  ? Sexual activity: Not on file  ?Other Topics Concern  ? Not on file  ?Social History Narrative  ? Not on file  ? ?Social Determinants of Health  ? ?Financial Resource Strain: Not on file  ?Food Insecurity: Not on file  ?Transportation Needs: Not on file  ?Physical Activity: Not on file  ?Stress: Not on file  ?Social Connections: Not on file  ?Intimate Partner Violence: Not on file  ? ? ?Outpatient Medications Prior to Visit  ?Medication Sig Dispense Refill  ? cetirizine (ZYRTEC) 10 MG tablet Take 10 mg by mouth daily.    ? hydrOXYzine (ATARAX) 10 MG  tablet Take 1 tablet (10 mg total) by mouth 3 (three) times daily as needed for anxiety. 60 tablet 0  ? omeprazole (PRILOSEC) 40 MG capsule Take 1 capsule (40 mg total) by mouth daily. 30 capsule 2  ? ?No facility-administered medications prior to visit.  ? ? ?Allergies  ?Allergen Reactions  ? Ciprofloxacin Other (See Comments)  ?  Headache ?  ? ? ?Review of Systems ? ?   ?Objective:  ?  ?Physical Exam ? ?There were no vitals taken for this visit. ?Wt Readings from Last 3 Encounters:  ?08/11/21 156 lb (70.8 kg)  ?05/27/21 155 lb (70.3 kg)  ?05/24/21 150 lb (68 kg)  ? ? ?Health Maintenance Due  ?Topic Date Due  ? HIV Screening  Never done  ? Hepatitis C Screening  Never done  ? TETANUS/TDAP  04/20/2017  ? PAP SMEAR-Modifier  12/16/2019  ? INFLUENZA VACCINE  Never done  ? ? ?There are no preventive care reminders to display for this patient. ? ? ?Lab Results  ?Component Value Date  ? TSH 1.33 08/11/2021  ? ?Lab Results  ?Component Value Date  ? WBC 4.7 08/11/2021  ? HGB 14.2 08/11/2021  ?  HCT 42.3 08/11/2021  ? MCV 90.6 08/11/2021  ? PLT 194 08/11/2021  ? ?Lab Results  ?Component Value Date  ? NA 140 08/11/2021  ? K 3.7 08/11/2021  ? CO2 30 08/11/2021  ? GLUCOSE 90 08/11/2021  ? BUN 11 08/11/2021  ? CREATININE 0.88 08/11/2021  ? BILITOT 0.8 08/11/2021  ? AST 11 08/11/2021  ? ALT 6 08/11/2021  ? PROT 7.7 08/11/2021  ? CALCIUM 9.4 08/11/2021  ? EGFR 91 08/11/2021  ? ?Lab Results  ?Component Value Date  ? CHOL 193 06/01/2018  ? ?Lab Results  ?Component Value Date  ? HDL 55 06/01/2018  ? ?Lab Results  ?Component Value Date  ? LDLCALC 121 (H) 06/01/2018  ? ?Lab Results  ?Component Value Date  ? TRIG 75 06/01/2018  ? ?Lab Results  ?Component Value Date  ? CHOLHDL 3.5 06/01/2018  ? ?Lab Results  ?Component Value Date  ? HGBA1C 4.7 02/26/2020  ? ? ?   ?Assessment & Plan:  ? ?Problem List Items Addressed This Visit   ?None ? ? ? ?No orders of the defined types were placed in this encounter. ? ? ? ?Beatrice Lecher, MD ? ?

## 2021-10-22 ENCOUNTER — Other Ambulatory Visit: Payer: Self-pay

## 2021-10-22 ENCOUNTER — Emergency Department (INDEPENDENT_AMBULATORY_CARE_PROVIDER_SITE_OTHER)
Admission: EM | Admit: 2021-10-22 | Discharge: 2021-10-22 | Disposition: A | Discharge status: Home / Self Care | Payer: BC Managed Care – PPO | Source: Home / Self Care

## 2021-10-22 ENCOUNTER — Emergency Department: Admit: 2021-10-22 | Discharge status: Absent without leave | Payer: Self-pay

## 2021-10-22 ENCOUNTER — Emergency Department (INDEPENDENT_AMBULATORY_CARE_PROVIDER_SITE_OTHER): Payer: BC Managed Care – PPO

## 2021-10-22 ENCOUNTER — Encounter: Payer: Self-pay | Admitting: Emergency Medicine

## 2021-10-22 DIAGNOSIS — K824 Cholesterolosis of gallbladder: Secondary | ICD-10-CM | POA: Diagnosis not present

## 2021-10-22 DIAGNOSIS — R1011 Right upper quadrant pain: Secondary | ICD-10-CM | POA: Diagnosis not present

## 2021-10-22 DIAGNOSIS — R1010 Upper abdominal pain, unspecified: Secondary | ICD-10-CM | POA: Diagnosis not present

## 2021-10-22 DIAGNOSIS — R1012 Left upper quadrant pain: Secondary | ICD-10-CM

## 2021-10-22 NOTE — ED Provider Notes (Signed)
?[facility] ? ? ? ?CSN: [id number] ?Arrival date & time: 10/22/21  1016 ? ? ?  ? ?History   ?Chief Complaint ?Chief Complaint  ?Patient presents with  ? Abdominal Pain  ? ? ?HPI ?Cindy Dunlap is a 31 y.o. female.  ? ?HPI female presents with continued right upper quadrant abdominal pain.  Patient was evaluated by me here on 10/17/2021.  Patient was placed on trial of Omeprazole for GERD/gastritis like symptoms.  Patient reports continues to have right and now left upper quadrant pain with bloating which radiates to bilateral shoulder blades.  Patient reports worse yesterday after eating bagel and 2 eggs.  Patient reports omeprazole makes her stomach burn.  Patient reports she is unable to get a hold of anyone at [facility] where she has been previously followed.  Additionally, patient reports that she is concerned with gallbladder issues and is unable to get into PCP until next week.  Patient reports only able to eat bland foods with grilled or baked chicken without seasoning.  Patient reports last bowel movements on Saturday and yesterday, Tuesday.  Patient currently denies nausea, vomiting or diarrhea.  Denies dysuria, urinary frequency, and/or flank pain ? ?Past Medical History:  ?Diagnosis Date  ? GERD (gastroesophageal reflux disease)   ? ? ?Patient Active Problem List  ? Diagnosis Date Noted  ? Epigastric pain determined by examination 07/29/2018  ? Nausea 07/29/2018  ? History of urinary tract infection 07/29/2018  ? Birth control 01/14/2016  ? History of anemia 01/14/2016  ? Family history of cardiac disorder 01/14/2016  ? ? ?Past Surgical History:  ?Procedure Laterality Date  ? DILATION AND CURETTAGE OF UTERUS  2015  ? ? ?OB History   ?No obstetric history on file. ?  ? ? ? ?Home Medications   ? ?Prior to Admission medications   ?Medication Sig Start Date End Date Taking? Authorizing Provider  ?cetirizine (ZYRTEC) 10 MG tablet Take 10 mg by mouth daily.   Yes [provider]   ?hydrOXYzine (ATARAX) 10 MG tablet Take 1 tablet (10 mg total) by mouth 3 (three) times daily as needed for anxiety. 08/11/21  Yes Breeback, Jade L, PA-C  ?omeprazole (PRILOSEC) 40 MG capsule Take 1 capsule (40 mg total) by mouth daily. ?Patient not taking: Reported on 10/22/2021 08/11/21   Jomarie Longs, PA-C  ? ? ?Family History ?Family History  ?Problem Relation Age of Onset  ? Atrial fibrillation Mother   ? Hyperlipidemia Father   ? Hypertension Father   ? Heart attack Father   ? Lymphoma Maternal Grandmother   ? Dementia Maternal Grandfather   ? Diabetes Paternal Grandmother   ? Stroke Paternal Grandmother   ? Heart attack Paternal Grandmother   ? Cancer Paternal Grandfather   ?     LIVER  ? Heart attack Paternal Grandfather   ? Colon cancer Maternal Uncle   ? Stomach cancer Paternal Uncle   ? Esophageal cancer Paternal Uncle   ? Pancreatic cancer Neg Hx   ? Rectal cancer Neg Hx   ? ? ?Social History ?Social History  ? ?Tobacco Use  ? Smoking status: Never  ? Smokeless tobacco: Never  ?Vaping Use  ? Vaping Use: Never used  ?Substance Use Topics  ? Alcohol use: Not Currently  ? Drug use: Never  ? ? ? ?Allergies   ?Ciprofloxacin ? ? ?Review of Systems ?Review of Systems  ?Gastrointestinal:  Positive for abdominal pain.  ?All other systems reviewed and are negative. ? ? ?  Physical Exam ?Triage Vital Signs ?ED Triage Vitals  ?Enc Vitals Group  ?   BP 10/22/21 1026 110/76  ?   Pulse Rate 10/22/21 1026 75  ?   Resp 10/22/21 1026 16  ?   Temp 10/22/21 1026 98.3 ?F (36.8 ?C)  ?   Temp Source 10/22/21 1026 Oral  ?   SpO2 10/22/21 1026 100 %  ?   Weight --   ?   Height 10/22/21 1031 5\' 6"  (1.676 m)  ?   Head Circumference --   ?   Peak Flow --   ?   Pain Score 10/22/21 1031 2  ?   Pain Loc --   ?   Pain Edu? --   ?   Excl. in GC? --   ? ?No data found. ? ?Updated Vital Signs ?BP 110/76 (BP Location: Right Arm)   Pulse 75   Temp 98.3 ?F (36.8 ?C) (Oral)   Resp 16   Ht 5\' 6"  (1.676 m)   LMP 10/18/2021 (Exact Date)    SpO2 100%   BMI 25.18 kg/m?  ? ?  ? ?Physical Exam ?Vitals and nursing note reviewed.  ?Constitutional:   ?   Appearance: Normal appearance. She is well-developed and normal weight. She is ill-appearing.  ?HENT:  ?   Head: Normocephalic and atraumatic.  ?   Mouth/Throat:  ?   Mouth: Mucous membranes are moist.  ?   Pharynx: Oropharynx is clear.  ?Eyes:  ?   Extraocular Movements: Extraocular movements intact.  ?   Conjunctiva/sclera: Conjunctivae normal.  ?   Pupils: Pupils are equal, round, and reactive to light.  ?Cardiovascular:  ?   Rate and Rhythm: Normal rate and regular rhythm.  ?   Pulses: Normal pulses.  ?   Heart sounds: Normal heart sounds. No murmur heard. ?Pulmonary:  ?   Effort: Pulmonary effort is normal.  ?   Breath sounds: Normal breath sounds. No wheezing, rhonchi or rales.  ?Abdominal:  ?   General: Abdomen is flat and scaphoid. Bowel sounds are absent. There is no distension.  ?   Palpations: Abdomen is soft. There is no shifting dullness, fluid wave, hepatomegaly, splenomegaly, mass or pulsatile mass.  ?   Tenderness: There is abdominal tenderness in the right upper quadrant and left upper quadrant. There is no right CVA tenderness, left CVA tenderness, guarding or rebound. Positive signs include Murphy's sign. Negative signs include McBurney's sign and psoas sign.  ?   Hernia: No hernia is present.  ?Musculoskeletal:     ?   General: Normal range of motion.  ?   Cervical back: Normal range of motion. No tenderness.  ?Lymphadenopathy:  ?   Cervical: No cervical adenopathy.  ?Skin: ?   General: Skin is warm and dry.  ?Neurological:  ?   General: No focal deficit present.  ?   Mental Status: She is alert and oriented to person, place, and time.  ?   Motor: No weakness.  ?   Gait: Gait normal.  ? ? ? ?UC Treatments / Results  ?Labs ?(all labs ordered are listed, but only abnormal results are displayed) ?Labs Reviewed  ?COMPLETE METABOLIC PANEL WITH GFR  ?CBC WITH DIFFERENTIAL/PLATELET  ?LIPASE   ?H. PYLORI BREATH TEST  ? ? ?EKG ? ? ?Radiology ?US Abdomen Complete ? ?Result Date: 10/22/2021 ?CLINICAL DATA:  Six days of upper abdominal pain. EXAM: ABDOMEN ULTRASOUND COMPLETE COMPARISON:  Ultrasound November 23, 2019 FINDINGS: Gallbladder: Gallbladder polyps the largest  of which measures 7 mm. No gallstones or wall thickening visualized. No sonographic Murphy sign noted by sonographer. Common bile duct: Diameter: 4 mm Liver: No focal lesion identified. Within normal limits in parenchymal echogenicity. Portal vein is patent on color Doppler imaging with normal direction of blood flow towards the liver. IVC: No abnormality visualized. Pancreas: Visualized portion unremarkable. Spleen: Size and appearance within normal limits. Right Kidney: Length: 11.5 cm. Echogenicity within normal limits. No mass or hydronephrosis visualized. Left Kidney: Length: 11.6 cm. Echogenicity within normal limits. No mass or hydronephrosis visualized. Abdominal aorta: No aneurysm visualized. Other findings: None. IMPRESSION: No acute findings within the abdomen. Gallbladder polyps, largest measuring 7 mm. Suggest yearly follow up with ultrasound to ensure no interval growth. Per consensus guidelines, this requires no additional evaluation or specific follow-up. This recommendation follows ACR consensus guidelines: White Paper of the ACR Incidental findings Committee II on Gallbladder and Biliary Findings. J Am Coll Radiol 2013:;10:953-956. Electronically Signed   By: Maudry Mayhew M.D.   On: 10/22/2021 11:48   ? ?Procedures ?Procedures (including critical care time) ? ?Medications Ordered in UC ?Medications - No data to display ? ?Initial Impression / Assessment and Plan / UC Course  ?I have reviewed the triage vital signs and the nursing notes. ? ?Pertinent labs & imaging results that were available during my care of the patient were reviewed by me and considered in my medical decision making (see chart for details). ? ?  ? ?MDM: 1.   Abdominal pain, right upper quadrant-complete ultrasound of abdomen revealed above, CBC, CMP, lipase ordered. Advised/informed patient of results from ultrasound of complete abdomen this morning, with hard copy provided to

## 2021-10-22 NOTE — Discharge Instructions (Addendum)
Advised/informed patient of results from ultrasound of complete abdomen this morning, with hard copy provided to patient.  Advised patient we will follow-up with lab work once received.  Advised patient to follow-up with PCP for possible GI referral further evaluation. ?

## 2021-10-22 NOTE — ED Triage Notes (Signed)
Continues to have R/L upper quadrant pain w/ bloating  ?Upper back pain between shoulder blades  ?Pain worse yesterday after eating a bagel & 2 eggs ?Prilosec makes her stomach burn ?Unable to get hold of anyone at [facility]  ?Unable to see PCP until next week  ?Concerns for Gall bladder issues  ?Only able to eat bland foods w/ grilled or baked chicken - no seasoning  ?BM on Saturday & yesterday  ?

## 2021-10-22 NOTE — ED Notes (Signed)
H pylori test in progress- pt has given 1st sample  & drank the solution. Timer on.  ?

## 2021-10-23 LAB — CBC WITH DIFFERENTIAL/PLATELET
Absolute Monocytes: 381 cells/uL (ref 200–950)
Basophils Absolute: 8 cells/uL (ref 0–200)
Basophils Relative: 0.2 %
Eosinophils Absolute: 8 cells/uL — ABNORMAL LOW (ref 15–500)
Eosinophils Relative: 0.2 %
HCT: 42 % (ref 35.0–45.0)
Hemoglobin: 13.9 g/dL (ref 11.7–15.5)
Lymphs Abs: 1197 cells/uL (ref 850–3900)
MCH: 30.6 pg (ref 27.0–33.0)
MCHC: 33.1 g/dL (ref 32.0–36.0)
MCV: 92.5 fL (ref 80.0–100.0)
MPV: 11.5 fL (ref 7.5–12.5)
Monocytes Relative: 9.3 %
Neutro Abs: 2505 cells/uL (ref 1500–7800)
Neutrophils Relative %: 61.1 %
Platelets: 221 10*3/uL (ref 140–400)
RBC: 4.54 10*6/uL (ref 3.80–5.10)
RDW: 11.8 % (ref 11.0–15.0)
Total Lymphocyte: 29.2 %
WBC: 4.1 10*3/uL (ref 3.8–10.8)

## 2021-10-23 LAB — COMPLETE METABOLIC PANEL WITH GFR
AG Ratio: 1.5 (calc) (ref 1.0–2.5)
ALT: 6 U/L (ref 6–29)
AST: 12 U/L (ref 10–30)
Albumin: 4.4 g/dL (ref 3.6–5.1)
Alkaline phosphatase (APISO): 62 U/L (ref 31–125)
BUN: 13 mg/dL (ref 7–25)
CO2: 23 mmol/L (ref 20–32)
Calcium: 9.4 mg/dL (ref 8.6–10.2)
Chloride: 104 mmol/L (ref 98–110)
Creat: 0.84 mg/dL (ref 0.50–0.97)
Globulin: 2.9 g/dL (calc) (ref 1.9–3.7)
Glucose, Bld: 82 mg/dL (ref 65–99)
Potassium: 4 mmol/L (ref 3.5–5.3)
Sodium: 138 mmol/L (ref 135–146)
Total Bilirubin: 0.7 mg/dL (ref 0.2–1.2)
Total Protein: 7.3 g/dL (ref 6.1–8.1)
eGFR: 96 mL/min/{1.73_m2} (ref >=60)

## 2021-10-23 LAB — H. PYLORI BREATH TEST: H. pylori Breath Test: NOT DETECTED

## 2021-10-23 LAB — LIPASE: Lipase: 20 U/L (ref 7–60)

## 2021-11-02 ENCOUNTER — Other Ambulatory Visit: Payer: Self-pay | Admitting: Physician Assistant

## 2021-11-02 DIAGNOSIS — R0789 Other chest pain: Secondary | ICD-10-CM

## 2021-11-17 ENCOUNTER — Other Ambulatory Visit: Payer: Self-pay | Admitting: Physician Assistant

## 2021-11-17 DIAGNOSIS — R0789 Other chest pain: Secondary | ICD-10-CM

## 2022-01-05 DIAGNOSIS — Z0001 Encounter for general adult medical examination with abnormal findings: Secondary | ICD-10-CM | POA: Diagnosis not present

## 2022-02-06 ENCOUNTER — Ambulatory Visit
Admission: EM | Admit: 2022-02-06 | Discharge: 2022-02-06 | Disposition: A | Discharge status: Home / Self Care | Payer: BC Managed Care – PPO | Attending: Family Medicine | Admitting: Family Medicine

## 2022-02-06 ENCOUNTER — Encounter: Payer: Self-pay | Admitting: Emergency Medicine

## 2022-02-06 DIAGNOSIS — R300 Dysuria: Secondary | ICD-10-CM | POA: Diagnosis not present

## 2022-02-06 LAB — POCT URINALYSIS DIP (MANUAL ENTRY)
Bilirubin, UA: NEGATIVE
Glucose, UA: NEGATIVE mg/dL
Ketones, POC UA: NEGATIVE mg/dL
Nitrite, UA: NEGATIVE
Protein Ur, POC: NEGATIVE mg/dL
Spec Grav, UA: <=1.005 — AB (ref 1.010–1.025)
Urobilinogen, UA: 0.2 E.U./dL
pH, UA: 5.5 (ref 5.0–8.0)

## 2022-02-06 MED ORDER — SULFAMETHOXAZOLE-TRIMETHOPRIM 800-160 MG PO TABS: 1.0000 | ORAL_TABLET | Freq: Two times a day (BID) | ORAL | 0 refills | Status: AC

## 2022-02-06 NOTE — ED Triage Notes (Signed)
Pt c/o dysuria and frequency that started about 1 week ago. States she tried to push fluids and resolve at home but still having symptoms. She is on her cycle at this time.

## 2022-02-06 NOTE — ED Provider Notes (Signed)
[facility]    CSN: [id number] Arrival date & time: 02/06/22  1729      History   Chief Complaint Chief Complaint  Patient presents with   Dysuria    HPI Cindy Dunlap is a 31 y.o. female.   HPI 31 year old female presents with dysuria for days.  PMH significant for UTI, and anemia.  Past Medical History:  Diagnosis Date   GERD (gastroesophageal reflux disease)     Patient Active Problem List   Diagnosis Date Noted   Epigastric pain determined by examination 07/29/2018   Nausea 07/29/2018   History of urinary tract infection 07/29/2018   Birth control 01/14/2016   History of anemia 01/14/2016   Family history of cardiac disorder 01/14/2016    Past Surgical History:  Procedure Laterality Date   DILATION AND CURETTAGE OF UTERUS  2015    OB History   No obstetric history on file.      Home Medications    Prior to Admission medications   Medication Sig Start Date End Date Taking? Authorizing Provider  sulfamethoxazole-trimethoprim (BACTRIM DS) 800-160 MG tablet Take 1 tablet by mouth 2 (two) times daily for 7 days. 02/06/22 02/13/22 Yes Trevor Iha, FNP  cetirizine (ZYRTEC) 10 MG tablet Take 10 mg by mouth daily.    [provider]  hydrOXYzine (ATARAX) 10 MG tablet Take 1 tablet (10 mg total) by mouth 3 (three) times daily as needed for anxiety. 08/11/21   Breeback, Lonna Cobb, PA-C  omeprazole (PRILOSEC) 40 MG capsule Take 1 capsule (40 mg total) by mouth daily. Appt for further refills 11/03/21   Jomarie Longs, PA-C    Family History Family History  Problem Relation Age of Onset   Atrial fibrillation Mother    Hyperlipidemia Father    Hypertension Father    Heart attack Father    Lymphoma Maternal Grandmother    Dementia Maternal Grandfather    Diabetes Paternal Grandmother    Stroke Paternal Grandmother    Heart attack Paternal Grandmother    Cancer Paternal Grandfather        LIVER   Heart attack Paternal Grandfather    Colon  cancer Maternal Uncle    Stomach cancer Paternal Uncle    Esophageal cancer Paternal Uncle    Pancreatic cancer Neg Hx    Rectal cancer Neg Hx     Social History Social History   Tobacco Use   Smoking status: Never   Smokeless tobacco: Never  Vaping Use   Vaping Use: Never used  Substance Use Topics   Alcohol use: Not Currently   Drug use: Never     Allergies   Ciprofloxacin   Review of Systems Review of Systems  Genitourinary:  Positive for dysuria.     Physical Exam Triage Vital Signs ED Triage Vitals  Enc Vitals Group     BP      Pulse      Resp      Temp      Temp src      SpO2      Weight      Height      Head Circumference      Peak Flow      Pain Score      Pain Loc      Pain Edu?      Excl. in GC?    No data found.  Updated Vital Signs BP 125/71 (BP Location: Right Arm)   Pulse 86   Temp  98.3 F (36.8 C) (Oral)   Resp 18   LMP 02/06/2022   SpO2 98%  Physical Exam Vitals and nursing note reviewed.  Constitutional:      Appearance: Normal appearance.  HENT:     Head: Normocephalic and atraumatic.     Mouth/Throat:     Mouth: Mucous membranes are moist.     Pharynx: Oropharynx is clear.  Eyes:     Extraocular Movements: Extraocular movements intact.     Conjunctiva/sclera: Conjunctivae normal.     Pupils: Pupils are equal, round, and reactive to light.  Cardiovascular:     Rate and Rhythm: Normal rate and regular rhythm.     Pulses: Normal pulses.     Heart sounds: Normal heart sounds.  Pulmonary:     Effort: Pulmonary effort is normal.     Breath sounds: Normal breath sounds. No wheezing, rhonchi or rales.  Abdominal:     Tenderness: There is no right CVA tenderness or left CVA tenderness.  Musculoskeletal:     Cervical back: Normal range of motion and neck supple.  Skin:    General: Skin is warm and dry.  Neurological:     General: No focal deficit present.     Mental Status: She is alert and oriented to person, place, and  time. Mental status is at baseline.      UC Treatments / Results  Labs (all labs ordered are listed, but only abnormal results are displayed) Labs Reviewed  POCT URINALYSIS DIP (MANUAL ENTRY) - Abnormal; Notable for the following components:      Result Value   Spec Grav, UA <=1.005 (*)    Blood, UA large (*)    Leukocytes, UA Trace (*)    All other components within normal limits  URINE CULTURE    EKG   Radiology No results found.  Procedures Procedures (including critical care time)  Medications Ordered in UC Medications - No data to display  Initial Impression / Assessment and Plan / UC Course  I have reviewed the triage vital signs and the nursing notes.  Pertinent labs & imaging results that were available during my care of the patient were reviewed by me and considered in my medical decision making (see chart for details).     MDM: 1.  Dysuria-Rx'd Bactrim. Instructed patient to take medication as directed with food to completion.  Encourage patient increase daily water intake while taking this medication.  Advised patient we will follow-up with urine culture results once received.  Final Clinical Impressions(s) / UC Diagnoses   Final diagnoses:  Dysuria     Discharge Instructions      Instructed patient to take medication as directed with food to completion.  Encourage patient increase daily water intake while taking this medication.  Advised patient we will follow-up with urine culture results once received.     ED Prescriptions     Medication Sig Dispense Auth. Provider   sulfamethoxazole-trimethoprim (BACTRIM DS) 800-160 MG tablet Take 1 tablet by mouth 2 (two) times daily for 7 days. 14 tablet Trevor Iha, FNP      PDMP not reviewed this encounter.   Trevor Iha, FNP 02/06/22 1824

## 2022-02-06 NOTE — Discharge Instructions (Addendum)
Instructed patient to take medication as directed with food to completion.  Encourage patient increase daily water intake while taking this medication.  Advised patient we will follow-up with urine culture results once received.

## 2022-02-07 ENCOUNTER — Telehealth: Payer: Self-pay

## 2022-02-07 NOTE — Telephone Encounter (Signed)
Follow up call.  Pt states that she picked up her medication after her visit.  Pt states that she is starting to feel better. Pt didn't have any trouble with picking up her medication. Pt states that she doesn't have any questions.  Pt was instructed to give clinic a call if anything changes.

## 2022-02-08 LAB — URINE CULTURE

## 2022-02-12 ENCOUNTER — Telehealth: Payer: Self-pay

## 2022-02-12 ENCOUNTER — Emergency Department
Admission: EM | Admit: 2022-02-12 | Discharge: 2022-02-12 | Disposition: A | Discharge status: Home / Self Care | Payer: BC Managed Care – PPO | Attending: Family Medicine | Admitting: Family Medicine

## 2022-02-12 NOTE — Telephone Encounter (Signed)
Pt called regarding lab results. Pt is aware of results and states that she will return for recollection of specimen.

## 2022-03-02 DIAGNOSIS — R102 Pelvic and perineal pain: Secondary | ICD-10-CM | POA: Diagnosis not present

## 2022-03-02 DIAGNOSIS — R300 Dysuria: Secondary | ICD-10-CM | POA: Diagnosis not present

## 2022-03-02 DIAGNOSIS — N898 Other specified noninflammatory disorders of vagina: Secondary | ICD-10-CM | POA: Diagnosis not present

## 2022-04-06 DIAGNOSIS — N83202 Unspecified ovarian cyst, left side: Secondary | ICD-10-CM | POA: Diagnosis not present

## 2022-04-06 DIAGNOSIS — R102 Pelvic and perineal pain: Secondary | ICD-10-CM | POA: Diagnosis not present

## 2022-05-18 DIAGNOSIS — N941 Unspecified dyspareunia: Secondary | ICD-10-CM | POA: Diagnosis not present

## 2022-05-18 DIAGNOSIS — N83202 Unspecified ovarian cyst, left side: Secondary | ICD-10-CM | POA: Diagnosis not present

## 2022-05-22 DIAGNOSIS — R0789 Other chest pain: Secondary | ICD-10-CM | POA: Diagnosis not present

## 2022-05-22 DIAGNOSIS — R079 Chest pain, unspecified: Secondary | ICD-10-CM | POA: Diagnosis not present

## 2022-05-22 DIAGNOSIS — R000 Tachycardia, unspecified: Secondary | ICD-10-CM | POA: Diagnosis not present

## 2022-06-04 DIAGNOSIS — R197 Diarrhea, unspecified: Secondary | ICD-10-CM | POA: Diagnosis not present

## 2022-06-04 DIAGNOSIS — Z888 Allergy status to other drugs, medicaments and biological substances status: Secondary | ICD-10-CM | POA: Diagnosis not present

## 2022-06-04 DIAGNOSIS — K3189 Other diseases of stomach and duodenum: Secondary | ICD-10-CM | POA: Diagnosis not present

## 2022-06-04 DIAGNOSIS — R110 Nausea: Secondary | ICD-10-CM | POA: Diagnosis not present

## 2022-06-04 DIAGNOSIS — K8020 Calculus of gallbladder without cholecystitis without obstruction: Secondary | ICD-10-CM | POA: Diagnosis not present

## 2022-06-04 DIAGNOSIS — N3289 Other specified disorders of bladder: Secondary | ICD-10-CM | POA: Diagnosis not present

## 2022-06-04 DIAGNOSIS — K5730 Diverticulosis of large intestine without perforation or abscess without bleeding: Secondary | ICD-10-CM | POA: Diagnosis not present

## 2022-06-04 DIAGNOSIS — R1011 Right upper quadrant pain: Secondary | ICD-10-CM | POA: Diagnosis not present

## 2022-06-15 DIAGNOSIS — K8020 Calculus of gallbladder without cholecystitis without obstruction: Secondary | ICD-10-CM | POA: Insufficient documentation

## 2022-07-23 DIAGNOSIS — L729 Follicular cyst of the skin and subcutaneous tissue, unspecified: Secondary | ICD-10-CM | POA: Diagnosis not present

## 2022-07-23 DIAGNOSIS — M85621 Other cyst of bone, right upper arm: Secondary | ICD-10-CM | POA: Diagnosis not present

## 2022-07-23 DIAGNOSIS — D2360 Other benign neoplasm of skin of unspecified upper limb, including shoulder: Secondary | ICD-10-CM | POA: Diagnosis not present

## 2022-07-23 DIAGNOSIS — Z881 Allergy status to other antibiotic agents status: Secondary | ICD-10-CM | POA: Diagnosis not present

## 2022-07-23 DIAGNOSIS — M79601 Pain in right arm: Secondary | ICD-10-CM | POA: Diagnosis not present

## 2022-07-23 DIAGNOSIS — M8568 Other cyst of bone, other site: Secondary | ICD-10-CM | POA: Diagnosis not present

## 2022-07-24 DIAGNOSIS — M8568 Other cyst of bone, other site: Secondary | ICD-10-CM | POA: Diagnosis not present

## 2022-08-12 DIAGNOSIS — R002 Palpitations: Secondary | ICD-10-CM | POA: Insufficient documentation

## 2022-08-12 DIAGNOSIS — Z8249 Family history of ischemic heart disease and other diseases of the circulatory system: Secondary | ICD-10-CM | POA: Diagnosis not present

## 2022-08-13 DIAGNOSIS — R002 Palpitations: Secondary | ICD-10-CM | POA: Diagnosis not present

## 2022-09-01 DIAGNOSIS — R002 Palpitations: Secondary | ICD-10-CM | POA: Diagnosis not present

## 2022-09-06 DIAGNOSIS — R002 Palpitations: Secondary | ICD-10-CM | POA: Diagnosis not present

## 2022-11-20 ENCOUNTER — Encounter: Payer: Self-pay | Admitting: Emergency Medicine

## 2022-11-20 ENCOUNTER — Ambulatory Visit
Admission: EM | Admit: 2022-11-20 | Discharge: 2022-11-20 | Disposition: A | Discharge status: Home / Self Care | Payer: BC Managed Care – PPO | Attending: Family Medicine | Admitting: Family Medicine

## 2022-11-20 DIAGNOSIS — R42 Dizziness and giddiness: Secondary | ICD-10-CM

## 2022-11-20 MED ORDER — MECLIZINE HCL 25 MG PO TABS: 25.0000 mg | ORAL_TABLET | Freq: Three times a day (TID) | ORAL | 0 refills | Status: DC | PRN

## 2022-11-20 NOTE — Discharge Instructions (Signed)
Rest, increase fluid intake. °If symptoms become significantly worse during the night or over the weekend, proceed to the local emergency room.  °

## 2022-11-20 NOTE — ED Triage Notes (Signed)
Patient c/o dizziness x 3 days, headache and neck pain.  No injury.  Patient has taken 500mg  of Tylenol.  Denies any vomiting.

## 2022-11-20 NOTE — ED Provider Notes (Signed)
[facility]    CSN: [id number] Arrival date & time: 11/20/22  1759      History   Chief Complaint Chief Complaint  Patient presents with   Dizziness    HPI Cindy Dunlap is a 32 y.o. female.   Three days ago patient suddenly developed mild headache and dizziness that she describes as a sensation of spinning, worse with head movement.  She feels well otherwise and denies other symptoms including nausea/vomiting.  The history is provided by the patient.    Past Medical History:  Diagnosis Date   GERD (gastroesophageal reflux disease)     Patient Active Problem List   Diagnosis Date Noted   Epigastric pain determined by examination 07/29/2018   Nausea 07/29/2018   History of urinary tract infection 07/29/2018   Birth control 01/14/2016   History of anemia 01/14/2016   Family history of cardiac disorder 01/14/2016    Past Surgical History:  Procedure Laterality Date   DILATION AND CURETTAGE OF UTERUS  2015    OB History   No obstetric history on file.      Home Medications    Prior to Admission medications   Medication Sig Start Date End Date Taking? Authorizing Provider  cetirizine (ZYRTEC) 10 MG tablet Take 10 mg by mouth daily.   Yes [provider]  hydrOXYzine (ATARAX) 10 MG tablet Take 1 tablet (10 mg total) by mouth 3 (three) times daily as needed for anxiety. 08/11/21  Yes Breeback, Jade L, PA-C  meclizine (ANTIVERT) 25 MG tablet Take 1 tablet (25 mg total) by mouth 3 (three) times daily as needed for dizziness. 11/20/22  Yes Lattie Haw, MD  omeprazole (PRILOSEC) 40 MG capsule Take 1 capsule (40 mg total) by mouth daily. Appt for further refills 11/03/21   Jomarie Longs, PA-C    Family History Family History  Problem Relation Age of Onset   Atrial fibrillation Mother    Hyperlipidemia Father    Hypertension Father    Heart attack Father    Lymphoma Maternal Grandmother    Dementia Maternal Grandfather    Diabetes  Paternal Grandmother    Stroke Paternal Grandmother    Heart attack Paternal Grandmother    Cancer Paternal Grandfather        LIVER   Heart attack Paternal Grandfather    Colon cancer Maternal Uncle    Stomach cancer Paternal Uncle    Esophageal cancer Paternal Uncle    Pancreatic cancer Neg Hx    Rectal cancer Neg Hx     Social History Social History   Tobacco Use   Smoking status: Never   Smokeless tobacco: Never  Vaping Use   Vaping Use: Never used  Substance Use Topics   Alcohol use: Not Currently   Drug use: Never     Allergies   Ciprofloxacin   Review of Systems Review of Systems No sore throat No cough No pleuritic pain No wheezing No nasal congestion No post-nasal drainage No sinus pain/pressure No itchy/red eyes No earache + dizziness No hemoptysis No SOB No fever/chills No nausea No vomiting No abdominal pain No diarrhea No urinary symptoms No skin rash No fatigue No myalgias + headache   Physical Exam Triage Vital Signs ED Triage Vitals  Enc Vitals Group     BP 11/20/22 1833 114/75     Pulse Rate 11/20/22 1833 65     Resp 11/20/22 1833 18     Temp 11/20/22 1833 98.2 F (36.8 C)  Temp Source 11/20/22 1833 Oral     SpO2 11/20/22 1833 99 %     Weight 11/20/22 1834 169 lb (76.7 kg)     Height 11/20/22 1834 5\' 6"  (1.676 m)     Head Circumference --      Peak Flow --      Pain Score 11/20/22 1834 0     Pain Loc --      Pain Edu? --      Excl. in GC? --    No data found.  Updated Vital Signs BP 114/75 (BP Location: Right Arm)   Pulse 65   Temp 98.2 F (36.8 C) (Oral)   Resp 18   Ht 5\' 6"  (1.676 m)   Wt 76.7 kg   LMP 11/09/2022   SpO2 99%   BMI 27.28 kg/m   Visual Acuity Right Eye Distance:   Left Eye Distance:   Bilateral Distance:    Right Eye Near:   Left Eye Near:    Bilateral Near:     Physical Exam Nursing notes and Vital Signs reviewed. Appearance:  Patient appears stated age, and in no acute  distress Eyes:  Pupils are equal, round, and reactive to light and accomodation.  Extraocular movement is intact.  Conjunctivae are not inflamed.  Fundi benign.  Slight bilateral nystagmus present.  Ears:  Canals normal.  Tympanic membranes normal.  Nose: Normal turbinates.  No sinus tenderness.   Pharynx:  Normal Neck:  Supple. No adenopathy.   Lungs:  Clear to auscultation.  Breath sounds are equal.  Moving air well. Heart:  Regular rate and rhythm without murmurs, rubs, or gallops.  Abdomen:  Nontender without masses or hepatosplenomegaly.  Bowel sounds are present.  No CVA or flank tenderness.  Extremities:  No edema.  Skin:  No rash present.  Neurologic:  Cranial nerves 2 through 12 are normal.  Patellar, achilles, and elbow reflexes are normal.  Cerebellar function is intact (finger-to-nose and rapid alternating hand movement).  Gait and station are normal.  Grip strength symmetric bilaterally.    UC Treatments / Results  Labs (all labs ordered are listed, but only abnormal results are displayed) Labs Reviewed - No data to display  EKG   Radiology No results found.  Procedures Procedures (including critical care time)  Medications Ordered in UC Medications - No data to display  Initial Impression / Assessment and Plan / UC Course  I have reviewed the triage vital signs and the nursing notes.  Pertinent labs & imaging results that were available during my care of the patient were reviewed by me and considered in my medical decision making (see chart for details).    Benign neurologic exam. Begin Antivert 25mg . Followup with Family Doctor if not improved in one week.   Final Clinical Impressions(s) / UC Diagnoses   Final diagnoses:  Vertigo     Discharge Instructions      Rest, increase fluid intake.  If symptoms become significantly worse during the night or over the weekend, proceed to the local emergency room.     ED Prescriptions     Medication Sig  Dispense Auth. Provider   meclizine (ANTIVERT) 25 MG tablet Take 1 tablet (25 mg total) by mouth 3 (three) times daily as needed for dizziness. 20 tablet Lattie Haw, MD         Lattie Haw, MD 11/21/22 [phone]

## 2022-11-23 ENCOUNTER — Encounter: Payer: Self-pay | Admitting: Family Medicine

## 2022-11-23 ENCOUNTER — Ambulatory Visit (INDEPENDENT_AMBULATORY_CARE_PROVIDER_SITE_OTHER): Payer: BC Managed Care – PPO | Admitting: Family Medicine

## 2022-11-23 ENCOUNTER — Telehealth: Payer: Self-pay | Admitting: Physician Assistant

## 2022-11-23 VITALS — BP 110/62 | HR 87 | Resp 20 | Ht 66.0 in | Wt 159.0 lb

## 2022-11-23 DIAGNOSIS — R519 Headache, unspecified: Secondary | ICD-10-CM

## 2022-11-23 DIAGNOSIS — R110 Nausea: Secondary | ICD-10-CM | POA: Diagnosis not present

## 2022-11-23 DIAGNOSIS — R42 Dizziness and giddiness: Secondary | ICD-10-CM

## 2022-11-23 MED ORDER — ONDANSETRON HCL 4 MG PO TABS: 4.0000 mg | ORAL_TABLET | Freq: Three times a day (TID) | ORAL | 0 refills | Status: DC | PRN

## 2022-11-23 NOTE — Telephone Encounter (Signed)
Made in error

## 2022-11-23 NOTE — Progress Notes (Signed)
Acute Office Visit  Subjective:     Patient ID: Cindy Dunlap, female    DOB: [birth year], 32 y.o.   MRN: [id number]  Chief Complaint  Patient presents with   Follow-up   Dizziness    HPI Patient is in today for urgent care on April 26 for vertigo.  Still feels like she is on a boat.  She says symptoms probably started last Monday she went fishing and was standing on a peer and just noticed that things felt like they were really off.  But the next day she definitely noticed it.  She denies any ear pain no cold symptoms.  She has chronic ear ringing so nothing new.  No hearing loss.  She says she always has some chronic pain and pressure with her ears.  On Friday about 4 days ago she had a pretty bad headache from the top to the back of her head.  It pretty much lasted all day and even now she has a little pressure on the top of her head.  She says the sensation is constant is not really coming and going.  And today she actually feels little nauseated.  ROS      Objective:    BP 110/62 (BP Location: Left Arm, Cuff Size: Normal)   Pulse 87   Resp 20   Ht 5\' 6"  (1.676 m)   Wt 159 lb 0.6 oz (72.1 kg)   LMP 11/09/2022   SpO2 100%   BMI 25.67 kg/m    Physical Exam Constitutional:      Appearance: She is well-developed.  HENT:     Head: Normocephalic and atraumatic.     Right Ear: External ear normal.     Left Ear: External ear normal.     Nose: Nose normal.  Eyes:     Conjunctiva/sclera: Conjunctivae normal.     Pupils: Pupils are equal, round, and reactive to light.  Neck:     Thyroid: No thyromegaly.  Cardiovascular:     Rate and Rhythm: Normal rate and regular rhythm.     Heart sounds: Normal heart sounds.  Pulmonary:     Effort: Pulmonary effort is normal.     Breath sounds: Normal breath sounds. No wheezing.  Musculoskeletal:     Cervical back: Neck supple.  Lymphadenopathy:     Cervical: No cervical adenopathy.  Skin:    General: Skin is warm and dry.   Neurological:     General: No focal deficit present.     Mental Status: She is alert and oriented to person, place, and time.     Comments: Negative Dix-Hallpike maneuver.  Though she had some slight nystagmus after she sat up coming from the left side.     No results found for any visits on 11/23/22.      Assessment & Plan:   Problem List Items Addressed This Visit       Other   Nausea - Primary   Relevant Medications   ondansetron (ZOFRAN) 4 MG tablet   Other Relevant Orders   Ambulatory referral to Physical Therapy   MR Brain W Wo Contrast   Other Visit Diagnoses     Vertigo       Relevant Orders   Ambulatory referral to Physical Therapy   MR Brain W Wo Contrast   Nonintractable episodic headache, unspecified headache type       Relevant Orders   MR Brain W Wo Contrast      Vertigo - she  describes it as feeling like she is walking on a boat.  I was able to trigger nystagmus though I did notice some slight nystagmus after she sat up.  She says she notices the vertigo more when she is sitting still versus when she is moving.  Call if not better. Will refer to vestibular rehab and will order MRI for further workup;  she is also having HA and nausea.   Small amount of Zofran sent to the pharmacy as well.  Meds ordered this encounter  Medications   ondansetron (ZOFRAN) 4 MG tablet    Sig: Take 1 tablet (4 mg total) by mouth every 8 (eight) hours as needed for nausea or vomiting.    Dispense:  15 tablet    Refill:  0    Return if symptoms worsen or fail to improve.  Nani Gasser, MD

## 2022-11-23 NOTE — Telephone Encounter (Deleted)
In error

## 2022-11-29 ENCOUNTER — Ambulatory Visit (HOSPITAL_BASED_OUTPATIENT_CLINIC_OR_DEPARTMENT_OTHER)
Admission: RE | Admit: 2022-11-29 | Discharge: 2022-11-29 | Disposition: A | Discharge status: Home / Self Care | Payer: BC Managed Care – PPO | Source: Ambulatory Visit | Attending: Family Medicine | Admitting: Family Medicine

## 2022-11-29 DIAGNOSIS — R110 Nausea: Secondary | ICD-10-CM | POA: Diagnosis not present

## 2022-11-29 DIAGNOSIS — R42 Dizziness and giddiness: Secondary | ICD-10-CM | POA: Insufficient documentation

## 2022-11-29 DIAGNOSIS — R519 Headache, unspecified: Secondary | ICD-10-CM | POA: Insufficient documentation

## 2022-11-29 MED ORDER — GADOBUTROL 1 MMOL/ML IV SOLN
7.0000 mL | Freq: Once | INTRAVENOUS | Status: AC | PRN
  Administered 2022-11-29: 7 mL via INTRAVENOUS

## 2022-11-30 NOTE — Progress Notes (Signed)
Hi Cindy Dunlap, great news!  Nothing worrisome on the brain MRI scan.  No sign of mass or excess fluid or pressure or stroke etc.  Neck step is to get you in for vestibular rehab.  It can make a tremendous difference in the intensity of your symptoms.  If that is not helping, then we can discuss next steps.  Make sure you are staying well-hydrated too.

## 2022-12-04 ENCOUNTER — Telehealth: Payer: Self-pay | Admitting: Family Medicine

## 2022-12-04 NOTE — Telephone Encounter (Signed)
Unable to reach patient. Phone rings and hangs up.

## 2022-12-04 NOTE — Telephone Encounter (Signed)
I had rec treatments with vestibular rehab but we never heard back form her if she was OK with the referral.

## 2022-12-04 NOTE — Telephone Encounter (Signed)
Patient called had MRI on Sunday night and she says that she is still dizzy not sure what's going on please advise

## 2022-12-09 NOTE — Telephone Encounter (Signed)
Unable to reach patient, phone rings and hangs up.  

## 2022-12-11 NOTE — Telephone Encounter (Signed)
Attempted call to patient . Phone rang without answer. Could not leave a voice mail message.  

## 2022-12-11 NOTE — Telephone Encounter (Signed)
My opinion is to keep the appt, I would hate for her to cancel it today and then the symptoms come back this weekend and then they have to move her out to the end of the week to get her back in on the schedule.  They may still be able to work with her and find some things that are helpful.

## 2022-12-14 ENCOUNTER — Ambulatory Visit: Payer: BC Managed Care – PPO | Attending: Family Medicine | Admitting: Rehabilitative and Restorative Service Providers"

## 2022-12-14 NOTE — Telephone Encounter (Signed)
Attempted call to patient- phone rang without answer. Could not leave a voice mail message.

## 2022-12-14 NOTE — Therapy (Deleted)
OUTPATIENT PHYSICAL THERAPY VESTIBULAR EVALUATION   Patient Name: Cindy Dunlap MRN: [id number] DOB:Nov 29, 1990, 32 y.o., female Today's Date: 12/14/2022  END OF SESSION:   Past Medical History:  Diagnosis Date   GERD (gastroesophageal reflux disease)    Past Surgical History:  Procedure Laterality Date   DILATION AND CURETTAGE OF UTERUS  2015   Patient Active Problem List   Diagnosis Date Noted   Palpitations 08/12/2022   Gallstones 06/15/2022   Epigastric pain determined by examination 07/29/2018   Nausea 07/29/2018   History of urinary tract infection 07/29/2018   Birth control 01/14/2016   History of anemia 01/14/2016   Family history of cardiac disorder 01/14/2016    PCP: Nani Gasser, MD REFERRING PROVIDER: Nani Gasser, MD  REFERRING DIAG: vertigo  THERAPY DIAG:  No diagnosis found.  ONSET DATE: 11/20/22  Rationale for Evaluation and Treatment: Rehabilitation  SUBJECTIVE:   SUBJECTIVE STATEMENT:  Still feels like she is on a boat.  She says symptoms probably started last Monday she went fishing and was standing on a peer and just noticed that things felt like they were really off.  But the next day she definitely noticed it.  She denies any ear pain no cold symptoms.  She has chronic ear ringing so nothing new.  No hearing loss.  She says she always has some chronic pain and pressure with her ears.   On Friday about 4 days ago she had a pretty bad headache from the top to the back of her head.  It pretty much lasted all day and even now she has a little pressure on the top of her head.  She says the sensation is constant is not really coming and going.  And today she actually feels little nauseated.*** Pt accompanied by: self  Three days ago patient suddenly developed mild headache and dizziness that she describes as a sensation of spinning, worse with head movement. She feels well otherwise and denies other symptoms including  nausea/vomiting.  PERTINENT HISTORY: ***  PAIN:  Are you having pain? {OPRCPAIN:27236}  PRECAUTIONS: {Therapy precautions:24002}  WEIGHT BEARING RESTRICTIONS: {Yes ***/No:24003}  FALLS: Has patient fallen in last 6 months? {fallsyesno:27318}  LIVING ENVIRONMENT: Lives with: {OPRC lives with:25569::"lives with their family"} Lives in: {Lives in:25570} Stairs: {opstairs:27293} Has following equipment at home: {Assistive devices:23999}  PLOF: {PLOF:24004}  PATIENT GOALS: ***  OBJECTIVE:   DIAGNOSTIC FINDINGS: IMPRESSION: Unremarkable MRI appearance of the brain. No evidence of an acute intracranial abnormality.  COGNITION: Overall cognitive status: {cognition:24006}   SENSATION: {sensation:27233}  EDEMA:  {edema:24020}  MUSCLE TONE:  {LE tone:25568}  DTRs:  {DTR SITE:24025}  POSTURE:  {posture:25561}  Cervical ROM:    {AROM/PROM:27142} A/PROM (deg) eval  Flexion   Extension   Right lateral flexion   Left lateral flexion   Right rotation   Left rotation   (Blank rows = not tested)  STRENGTH: ***  LOWER EXTREMITY MMT:   MMT Right eval Left eval  Hip flexion    Hip abduction    Hip adduction    Hip internal rotation    Hip external rotation    Knee flexion    Knee extension    Ankle dorsiflexion    Ankle plantarflexion    Ankle inversion    Ankle eversion    (Blank rows = not tested)  BED MOBILITY:  {Bed mobility:24027}  TRANSFERS: Assistive device utilized: {Assistive devices:23999}  Sit to stand: {Levels of assistance:24026} Stand to sit: {Levels of assistance:24026} Chair to chair: {Levels of  assistance:24026} Floor: {Levels of assistance:24026}  RAMP: {Levels of assistance:24026}  CURB: {Levels of assistance:24026}  GAIT: Gait pattern: {gait characteristics:25376} Distance walked: *** Assistive device utilized: {Assistive devices:23999} Level of assistance: {Levels of assistance:24026} Comments: ***  FUNCTIONAL TESTS:   {Functional tests:24029}  PATIENT SURVEYS:  {rehab surveys:24030}  VESTIBULAR ASSESSMENT:  GENERAL OBSERVATION: ***   SYMPTOM BEHAVIOR:  Subjective history: ***  Non-Vestibular symptoms: {nonvestibular symptoms:25260}  Type of dizziness: {Type of Dizziness:25255}  Frequency: ***  Duration: ***  Aggravating factors: {Aggravating Factors:25258}  Relieving factors: {Relieving Factors:25259}  Progression of symptoms: {DESC; BETTER/WORSE:18575}  OCULOMOTOR EXAM:  Ocular Alignment: {Ocular Alignment:25262}  Ocular ROM: {RANGE OF MOTION:21649}  Spontaneous Nystagmus: {Spontaneous nystagmus:25263}  Gaze-Induced Nystagmus: {gaze-induced nystagmus:25264}  Smooth Pursuits: {smooth pursuit:25265}  Saccades: {saccades:25266}  Convergence/Divergence: *** cm   FRENZEL - FIXATION SUPRESSED:  Ocular Alignment: {Ocular Alignment:25262}  Spontaneous Nystagmus: {Spontaneous nystagmus:25263}  Gaze-Induced Nystagmus: {gaze-induced nystagmus:25264}  Horizontal head shaking - induced nystagmus: {head shaking induced nystagmus:25267}  Vertical head shaking - induced nystagmus: {head shaking induced nystagmus:25267}  Positional tests: {Positional tests:25271}  Pressure tests: {frenzel pressure tests:25268}  VESTIBULAR - OCULAR REFLEX:   Slow VOR: {slow VOR:25290}  VOR Cancellation: {vor cancellation:25291}  Head-Impulse Test: {head impulse test:25272}  Dynamic Visual Acuity: {dynamic visual acuity:25273}   POSITIONAL TESTING: {Positional tests:25271}  MOTION SENSITIVITY:  Motion Sensitivity Quotient Intensity: 0 = none, 1 = Lightheaded, 2 = Mild, 3 = Moderate, 4 = Severe, 5 = Vomiting  Intensity  1. Sitting to supine   2. Supine to L side   3. Supine to R side   4. Supine to sitting   5. L Hallpike-Dix   6. Up from L    7. R Hallpike-Dix   8. Up from R    9. Sitting, head tipped to L knee   10. Head up from L knee   11. Sitting, head tipped to R knee   12. Head up from R knee    13. Sitting head turns x5   14.Sitting head nods x5   15. In stance, 180 turn to L    16. In stance, 180 turn to R     OTHOSTATICS: {Exam; orthostatics:31331}  FUNCTIONAL GAIT: {Functional tests:24029}   VESTIBULAR TREATMENT:                                                                                                   DATE: ***  Canalith Repositioning:  {Canalith Repositioning:25283} Gaze Adaptation:  {gaze adaptation:25286} Habituation:  {habituation:25288} Other: ***  PATIENT EDUCATION: Education details: *** Person educated: {Person educated:25204} Education method: {Education Method:25205} Education comprehension: {Education Comprehension:25206}  HOME EXERCISE PROGRAM:  GOALS: Goals reviewed with patient? {yes/no:20286}  SHORT TERM GOALS: Target date: ***  *** Baseline: Goal status: {GOALSTATUS:25110}  2.  *** Baseline:  Goal status: {GOALSTATUS:25110}  3.  *** Baseline:  Goal status: {GOALSTATUS:25110}  4.  *** Baseline:  Goal status: {GOALSTATUS:25110}  5.  *** Baseline:  Goal status: {GOALSTATUS:25110}  6.  *** Baseline:  Goal status: {GOALSTATUS:25110}  LONG TERM GOALS: Target date: ***  *** Baseline:  Goal status: {  GOALSTATUS:25110}  2.  *** Baseline:  Goal status: {GOALSTATUS:25110}  3.  *** Baseline:  Goal status: {GOALSTATUS:25110}  4.  *** Baseline:  Goal status: {GOALSTATUS:25110}  5.  *** Baseline:  Goal status: {GOALSTATUS:25110}  6.  *** Baseline:  Goal status: {GOALSTATUS:25110}  ASSESSMENT:  CLINICAL IMPRESSION: Patient is a *** y.o. *** who was seen today for physical therapy evaluation and treatment for ***.   OBJECTIVE IMPAIRMENTS: {opptimpairments:25111}.   ACTIVITY LIMITATIONS: {activitylimitations:27494}  PARTICIPATION LIMITATIONS: {participationrestrictions:25113}  PERSONAL FACTORS: {Personal factors:25162} are also affecting patient's functional outcome.   REHAB POTENTIAL:  {rehabpotential:25112}  CLINICAL DECISION MAKING: {clinical decision making:25114}  EVALUATION COMPLEXITY: {Evaluation complexity:25115}   PLAN:  PT FREQUENCY: {rehab frequency:25116}  PT DURATION: {rehab duration:25117}  PLANNED INTERVENTIONS: {rehab planned interventions:25118::"Therapeutic exercises","Therapeutic activity","Neuromuscular re-education","Balance training","Gait training","Patient/Family education","Self Care","Joint mobilization"}  PLAN FOR NEXT SESSION: ***   Jaylan Hinojosa, PT 12/14/2022, 1:11 PM

## 2022-12-22 NOTE — Telephone Encounter (Signed)
Attempted call again. Phone rang without answer. Could not leave a voic email message.

## 2022-12-24 NOTE — Telephone Encounter (Signed)
Unable to  contact patient - and time for appt has past- will await patient return visit.

## 2023-04-07 DIAGNOSIS — R519 Headache, unspecified: Secondary | ICD-10-CM | POA: Diagnosis not present

## 2023-04-07 DIAGNOSIS — Z20818 Contact with and (suspected) exposure to other bacterial communicable diseases: Secondary | ICD-10-CM | POA: Diagnosis not present

## 2023-04-07 DIAGNOSIS — B9689 Other specified bacterial agents as the cause of diseases classified elsewhere: Secondary | ICD-10-CM | POA: Diagnosis not present

## 2023-04-07 DIAGNOSIS — J028 Acute pharyngitis due to other specified organisms: Secondary | ICD-10-CM | POA: Diagnosis not present

## 2023-04-22 ENCOUNTER — Ambulatory Visit
Admission: EM | Admit: 2023-04-22 | Discharge: 2023-04-22 | Disposition: A | Discharge status: Home / Self Care | Payer: BC Managed Care – PPO

## 2023-04-22 DIAGNOSIS — L720 Epidermal cyst: Secondary | ICD-10-CM

## 2023-04-22 DIAGNOSIS — L739 Follicular disorder, unspecified: Secondary | ICD-10-CM

## 2023-04-22 NOTE — Discharge Instructions (Addendum)
Advised patient to take previously prescribed Amoxicillin daily with food to completion.  Encouraged to increase daily water intake while taking this medication.  Advised if symptoms worsen and/or unresolved please follow-up with PCP or here for further evaluation.

## 2023-04-22 NOTE — ED Provider Notes (Signed)
[facility]    CSN: [id number] Arrival date & time: 04/22/23  1834      History   Chief Complaint Chief Complaint  Patient presents with   Mass    HPI Cindy Dunlap is a 32 y.o. female.   HPI Pleasant 32 year old female presents with bump on top of her head for 3 days.  Reports the bump is tender to touch. PMH significant for GERD, palpitations, and gallstones.  Patient is accompanied by her son this evening.  Past Medical History:  Diagnosis Date   GERD (gastroesophageal reflux disease)     Patient Active Problem List   Diagnosis Date Noted   Palpitations 08/12/2022   Gallstones 06/15/2022   Epigastric pain determined by examination 07/29/2018   Nausea 07/29/2018   History of urinary tract infection 07/29/2018   Birth control 01/14/2016   History of anemia 01/14/2016   Family history of cardiac disorder 01/14/2016    Past Surgical History:  Procedure Laterality Date   DILATION AND CURETTAGE OF UTERUS  2015    OB History   No obstetric history on file.      Home Medications    Prior to Admission medications   Medication Sig Start Date End Date Taking? Authorizing Provider  amoxicillin (AMOXIL) 500 MG tablet Take 500 mg by mouth 2 (two) times daily. 04/08/23  Yes [provider]  cetirizine (ZYRTEC) 10 MG tablet Take 10 mg by mouth daily.   Yes [provider]  hydrOXYzine (ATARAX) 10 MG tablet Take 1 tablet (10 mg total) by mouth 3 (three) times daily as needed for anxiety. 08/11/21  Yes Breeback, Jade L, PA-C  meclizine (ANTIVERT) 25 MG tablet Take 1 tablet (25 mg total) by mouth 3 (three) times daily as needed for dizziness. 11/20/22  Yes Lattie Haw, MD  omeprazole (PRILOSEC) 40 MG capsule Take 1 capsule (40 mg total) by mouth daily. Appt for further refills 11/03/21  Yes Breeback, Jade L, PA-C  ondansetron (ZOFRAN) 4 MG tablet Take 1 tablet (4 mg total) by mouth every 8 (eight) hours as needed for nausea or vomiting. 11/23/22   Yes Agapito Games, MD    Family History Family History  Problem Relation Age of Onset   Atrial fibrillation Mother    Hyperlipidemia Father    Hypertension Father    Heart attack Father    Lymphoma Maternal Grandmother    Dementia Maternal Grandfather    Diabetes Paternal Grandmother    Stroke Paternal Grandmother    Heart attack Paternal Grandmother    Cancer Paternal Grandfather        LIVER   Heart attack Paternal Grandfather    Colon cancer Maternal Uncle    Stomach cancer Paternal Uncle    Esophageal cancer Paternal Uncle    Pancreatic cancer Neg Hx    Rectal cancer Neg Hx     Social History Social History   Tobacco Use   Smoking status: Never   Smokeless tobacco: Never  Vaping Use   Vaping status: Never Used  Substance Use Topics   Alcohol use: Not Currently   Drug use: Never     Allergies   Ciprofloxacin   Review of Systems Review of Systems  Skin:  Positive for rash.       Concerned with possible abscess/bump of central scalp     Physical Exam Triage Vital Signs ED Triage Vitals [04/22/23 1844]  Encounter Vitals Group     BP 127/83     Systolic  BP Percentile      Diastolic BP Percentile      Pulse Rate (!) 101     Resp 16     Temp 98.2 F (36.8 C)     Temp Source Oral     SpO2 98 %     Weight      Height      Head Circumference      Peak Flow      Pain Score      Pain Loc      Pain Education      Exclude from Growth Chart    No data found.  Updated Vital Signs BP 127/83 (BP Location: Left Arm)   Pulse (!) 101   Temp 98.2 F (36.8 C) (Oral)   Resp 16   LMP 04/11/2023 (Approximate)   SpO2 98%      Physical Exam Vitals and nursing note reviewed.  Constitutional:      Appearance: Normal appearance. She is normal weight.  HENT:     Head: Normocephalic and atraumatic.     Right Ear: External ear normal.     Left Ear: External ear normal.     Mouth/Throat:     Mouth: Mucous membranes are moist.     Pharynx:  Oropharynx is clear.  Eyes:     Extraocular Movements: Extraocular movements intact.     Conjunctiva/sclera: Conjunctivae normal.     Pupils: Pupils are equal, round, and reactive to light.  Cardiovascular:     Rate and Rhythm: Normal rate and regular rhythm.     Pulses: Normal pulses.     Heart sounds: Normal heart sounds.  Pulmonary:     Effort: Pulmonary effort is normal.     Breath sounds: Normal breath sounds. No wheezing, rhonchi or rales.  Musculoskeletal:        General: Normal range of motion.     Cervical back: Normal range of motion and neck supple.  Skin:    General: Skin is warm and dry.     Comments: Central scalp area small (< 1.0 cm x 1.0 cm circular shaped) cutaneous bump noted please see image below  Neurological:     General: No focal deficit present.     Mental Status: She is alert and oriented to person, place, and time. Mental status is at baseline.  Psychiatric:        Mood and Affect: Mood normal.        Behavior: Behavior normal.        Thought Content: Thought content normal.      UC Treatments / Results  Labs (all labs ordered are listed, but only abnormal results are displayed) Labs Reviewed - No data to display  EKG   Radiology No results found.  Procedures Procedures (including critical care time)  Medications Ordered in UC Medications - No data to display  Initial Impression / Assessment and Plan / UC Course  I have reviewed the triage vital signs and the nursing notes.  Pertinent labs & imaging results that were available during my care of the patient were reviewed by me and considered in my medical decision making (see chart for details).     MDM: 1.  Folliculitis-advised patient to take previously prescribed Amoxicillin 500 mg tablet twice daily x 7 days.  2.  Epidermoid cyst of skin of scalp-same as 1 advised patient to complete previously prescribed amoxicillin 500 mg tablet twice daily x 7 days.  Advised patient to take  previously  prescribed Amoxicillin daily with food to completion.  Encouraged to increase daily water intake while taking this medication.  Advised if symptoms worsen and/or unresolved please follow-up with PCP or here for further evaluation.  Patient discharged home, hemodynamically stable.  Final Clinical Impressions(s) / UC Diagnoses   Final diagnoses:  Folliculitis  Epidermoid cyst of skin of scalp     Discharge Instructions      Advised patient to take previously prescribed Amoxicillin daily with food to completion.  Encouraged to increase daily water intake while taking this medication.  Advised if symptoms worsen and/or unresolved please follow-up with PCP or here for further evaluation.     ED Prescriptions   None    PDMP not reviewed this encounter.   Trevor Iha, FNP 04/22/23 1920

## 2023-04-22 NOTE — ED Triage Notes (Signed)
Here for a bump on the top of her head x 3 days. Pt reports the bump is tender to touch.

## 2023-06-28 ENCOUNTER — Ambulatory Visit
Admission: EM | Admit: 2023-06-28 | Discharge: 2023-06-28 | Disposition: A | Discharge status: Home / Self Care | Payer: BC Managed Care – PPO | Attending: Family Medicine | Admitting: Family Medicine

## 2023-06-28 ENCOUNTER — Other Ambulatory Visit: Payer: Self-pay

## 2023-06-28 DIAGNOSIS — J029 Acute pharyngitis, unspecified: Secondary | ICD-10-CM

## 2023-06-28 LAB — POCT RAPID STREP A (OFFICE): Rapid Strep A Screen: NEGATIVE

## 2023-06-28 NOTE — ED Triage Notes (Addendum)
Sore throat since last night, has felt feverish. Has taken ibuprofen

## 2023-06-28 NOTE — Discharge Instructions (Addendum)
Rest, fluids, Tylenol or ibuprofen for pain and fever.  May use over-the-counter cough and cold medicine

## 2023-06-29 NOTE — ED Provider Notes (Signed)
[facility]    CSN: [id number] Arrival date & time: 06/28/23  1801      History   Chief Complaint Chief Complaint  Patient presents with   Sore Throat    HPI Cindy Dunlap is a 32 y.o. female.   HPI  Cindy Dunlap is known to me from prior visits.  She has a pleasant young mother of 4 children.  She is here today with 2 of the children, all 3 of them have sore throat.  She is also felt feverish.  She is concerned for strep.  No real runny nose cough or additional symptoms  Past Medical History:  Diagnosis Date   GERD (gastroesophageal reflux disease)     Patient Active Problem List   Diagnosis Date Noted   Palpitations 08/12/2022   Gallstones 06/15/2022   Epigastric pain determined by examination 07/29/2018   Nausea 07/29/2018   History of urinary tract infection 07/29/2018   Birth control 01/14/2016   History of anemia 01/14/2016   Family history of cardiac disorder 01/14/2016    Past Surgical History:  Procedure Laterality Date   DILATION AND CURETTAGE OF UTERUS  2015    OB History   No obstetric history on file.      Home Medications    Prior to Admission medications   Medication Sig Start Date End Date Taking? Authorizing Provider  dextromethorphan-guaiFENesin (MUCINEX DM) 30-600 MG 12hr tablet Take 1 tablet by mouth 2 (two) times daily.   Yes [provider]    Family History Family History  Problem Relation Age of Onset   Atrial fibrillation Mother    Hyperlipidemia Father    Hypertension Father    Heart attack Father    Lymphoma Maternal Grandmother    Dementia Maternal Grandfather    Diabetes Paternal Grandmother    Stroke Paternal Grandmother    Heart attack Paternal Grandmother    Cancer Paternal Grandfather        LIVER   Heart attack Paternal Grandfather    Colon cancer Maternal Uncle    Stomach cancer Paternal Uncle    Esophageal cancer Paternal Uncle    Pancreatic cancer Neg Hx    Rectal cancer Neg Hx      Social History Social History   Tobacco Use   Smoking status: Never   Smokeless tobacco: Never  Vaping Use   Vaping status: Never Used  Substance Use Topics   Alcohol use: Not Currently   Drug use: Never     Allergies   Ciprofloxacin   Review of Systems Review of Systems  See HPI Physical Exam Triage Vital Signs ED Triage Vitals  Encounter Vitals Group     BP 06/28/23 1843 123/82     Systolic BP Percentile --      Diastolic BP Percentile --      Pulse Rate 06/28/23 1843 72     Resp 06/28/23 1843 16     Temp 06/28/23 1843 97.9 F (36.6 C)     Temp src --      SpO2 06/28/23 1843 100 %     Weight --      Height --      Head Circumference --      Peak Flow --      Pain Score 06/28/23 1845 6     Pain Loc --      Pain Education --      Exclude from Growth Chart --    No data found.  Updated Vital  Signs BP 123/82   Pulse 72   Temp 97.9 F (36.6 C)   Resp 16   SpO2 100%      Physical Exam Constitutional:      General: She is not in acute distress.    Appearance: She is well-developed.  HENT:     Head: Normocephalic and atraumatic.     Right Ear: Tympanic membrane normal.     Left Ear: Tympanic membrane normal.     Nose: No congestion or rhinorrhea.     Mouth/Throat:     Pharynx: Pharyngeal swelling and posterior oropharyngeal erythema present.     Tonsils: No tonsillar exudate or tonsillar abscesses.  Eyes:     Conjunctiva/sclera: Conjunctivae normal.     Pupils: Pupils are equal, round, and reactive to light.  Cardiovascular:     Rate and Rhythm: Normal rate.  Pulmonary:     Effort: Pulmonary effort is normal. No respiratory distress.  Abdominal:     General: There is no distension.     Palpations: Abdomen is soft.  Musculoskeletal:        General: Normal range of motion.     Cervical back: Normal range of motion.  Lymphadenopathy:     Cervical: No cervical adenopathy.  Skin:    General: Skin is warm and dry.  Neurological:      Mental Status: She is alert.      UC Treatments / Results  Labs (all labs ordered are listed, but only abnormal results are displayed) Labs Reviewed  POCT RAPID STREP A (OFFICE)    EKG   Radiology No results found.  Procedures Procedures (including critical care time)  Medications Ordered in UC Medications - No data to display  Initial Impression / Assessment and Plan / UC Course  I have reviewed the triage vital signs and the nursing notes.  Pertinent labs & imaging results that were available during my care of the patient were reviewed by me and considered in my medical decision making (see chart for details).     Strep is negative Final Clinical Impressions(s) / UC Diagnoses   Final diagnoses:  Viral pharyngitis     Discharge Instructions      Rest, fluids, Tylenol or ibuprofen for pain and fever.  May use over-the-counter cough and cold medicine   ED Prescriptions   None    PDMP not reviewed this encounter.   Eustace Moore, MD 06/29/23 [phone]

## 2023-08-16 ENCOUNTER — Ambulatory Visit (INDEPENDENT_AMBULATORY_CARE_PROVIDER_SITE_OTHER): Payer: BC Managed Care – PPO | Admitting: Physician Assistant

## 2023-08-16 VITALS — BP 122/76 | HR 93 | Ht 66.0 in | Wt 177.0 lb

## 2023-08-16 DIAGNOSIS — L814 Other melanin hyperpigmentation: Secondary | ICD-10-CM

## 2023-08-16 DIAGNOSIS — Z13220 Encounter for screening for lipoid disorders: Secondary | ICD-10-CM | POA: Diagnosis not present

## 2023-08-16 DIAGNOSIS — M542 Cervicalgia: Secondary | ICD-10-CM

## 2023-08-16 DIAGNOSIS — Z0000 Encounter for general adult medical examination without abnormal findings: Secondary | ICD-10-CM

## 2023-08-16 DIAGNOSIS — G8929 Other chronic pain: Secondary | ICD-10-CM

## 2023-08-16 DIAGNOSIS — Z79899 Other long term (current) drug therapy: Secondary | ICD-10-CM

## 2023-08-16 DIAGNOSIS — Z131 Encounter for screening for diabetes mellitus: Secondary | ICD-10-CM | POA: Diagnosis not present

## 2023-08-16 DIAGNOSIS — F419 Anxiety disorder, unspecified: Secondary | ICD-10-CM

## 2023-08-16 DIAGNOSIS — R239 Unspecified skin changes: Secondary | ICD-10-CM

## 2023-08-16 MED ORDER — CLONAZEPAM 0.25 MG PO TBDP: 0.2500 mg | ORAL_TABLET | Freq: Two times a day (BID) | ORAL | 0 refills | Status: AC

## 2023-08-16 NOTE — Patient Instructions (Addendum)
Get labs  Will make referral to dermatology Get cervical pillow and start sleeping on side Use klonapin only as needed and sparingly  Health Maintenance, Female Adopting a healthy lifestyle and getting preventive care are important in promoting health and wellness. Ask your health care provider about: The right schedule for you to have regular tests and exams. Things you can do on your own to prevent diseases and keep yourself healthy. What should I know about diet, weight, and exercise? Eat a healthy diet  Eat a diet that includes plenty of vegetables, fruits, low-fat dairy products, and lean protein. Do not eat a lot of foods that are high in solid fats, added sugars, or sodium. Maintain a healthy weight Body mass index (BMI) is used to identify weight problems. It estimates body fat based on height and weight. Your health care provider can help determine your BMI and help you achieve or maintain a healthy weight. Get regular exercise Get regular exercise. This is one of the most important things you can do for your health. Most adults should: Exercise for at least 150 minutes each week. The exercise should increase your heart rate and make you sweat (moderate-intensity exercise). Do strengthening exercises at least twice a week. This is in addition to the moderate-intensity exercise. Spend less time sitting. Even light physical activity can be beneficial. Watch cholesterol and blood lipids Have your blood tested for lipids and cholesterol at 33 years of age, then have this test every 5 years. Have your cholesterol levels checked more often if: Your lipid or cholesterol levels are high. You are older than 33 years of age. You are at high risk for heart disease. What should I know about cancer screening? Depending on your health history and family history, you may need to have cancer screening at various ages. This may include screening for: Breast cancer. Cervical cancer. Colorectal  cancer. Skin cancer. Lung cancer. What should I know about heart disease, diabetes, and high blood pressure? Blood pressure and heart disease High blood pressure causes heart disease and increases the risk of stroke. This is more likely to develop in people who have high blood pressure readings or are overweight. Have your blood pressure checked: Every 3-5 years if you are 29-29 years of age. Every year if you are 1 years old or older. Diabetes Have regular diabetes screenings. This checks your fasting blood sugar level. Have the screening done: Once every three years after age 20 if you are at a normal weight and have a low risk for diabetes. More often and at a younger age if you are overweight or have a high risk for diabetes. What should I know about preventing infection? Hepatitis B If you have a higher risk for hepatitis B, you should be screened for this virus. Talk with your health care provider to find out if you are at risk for hepatitis B infection. Hepatitis C Testing is recommended for: Everyone born from [birth year] through 1965. Anyone with known risk factors for hepatitis C. Sexually transmitted infections (STIs) Get screened for STIs, including gonorrhea and chlamydia, if: You are sexually active and are younger than 33 years of age. You are older than 33 years of age and your health care provider tells you that you are at risk for this type of infection. Your sexual activity has changed since you were last screened, and you are at increased risk for chlamydia or gonorrhea. Ask your health care provider if you are at risk. Ask your health  care provider about whether you are at high risk for HIV. Your health care provider may recommend a prescription medicine to help prevent HIV infection. If you choose to take medicine to prevent HIV, you should first get tested for HIV. You should then be tested every 3 months for as long as you are taking the medicine. Pregnancy If you are  about to stop having your period (premenopausal) and you may become pregnant, seek counseling before you get pregnant. Take 400 to 800 micrograms (mcg) of folic acid every day if you become pregnant. Ask for birth control (contraception) if you want to prevent pregnancy. Osteoporosis and menopause Osteoporosis is a disease in which the bones lose minerals and strength with aging. This can result in bone fractures. If you are 65 years old or older, or if you are at risk for osteoporosis and fractures, ask your health care provider if you should: Be screened for bone loss. Take a calcium or vitamin D supplement to lower your risk of fractures. Be given hormone replacement therapy (HRT) to treat symptoms of menopause. Follow these instructions at home: Alcohol use Do not drink alcohol if: Your health care provider tells you not to drink. You are pregnant, may be pregnant, or are planning to become pregnant. If you drink alcohol: Limit how much you have to: 0-1 drink a day. Know how much alcohol is in your drink. In the [place] one drink equals one 12 oz bottle of beer (355 mL), one 5 oz glass of wine (148 mL), or one 1 oz glass of hard liquor (44 mL). Lifestyle Do not use any products that contain nicotine or tobacco. These products include cigarettes, chewing tobacco, and vaping devices, such as e-cigarettes. If you need help quitting, ask your health care provider. Do not use street drugs. Do not share needles. Ask your health care provider for help if you need support or information about quitting drugs. General instructions Schedule regular health, dental, and eye exams. Stay current with your vaccines. Tell your health care provider if: You often feel depressed. You have ever been abused or do not feel safe at home. Summary Adopting a healthy lifestyle and getting preventive care are important in promoting health and wellness. Follow your health care provider's instructions about  healthy diet, exercising, and getting tested or screened for diseases. Follow your health care provider's instructions on monitoring your cholesterol and blood pressure. This information is not intended to replace advice given to you by your health care provider. Make sure you discuss any questions you have with your health care provider. Document Revised: 12/02/2020 Document Reviewed: 12/02/2020 Elsevier Patient Education  2024 [employer].

## 2023-08-16 NOTE — Progress Notes (Unsigned)
Complete physical exam  Patient: Cindy Dunlap   DOB: 11/16/1990   32 y.o. Female  MRN: [id number]  Subjective:    Chief Complaint  Patient presents with   Annual Exam    Non fasting, derm referral for spot on ear    Cindy Dunlap is a 33 y.o. female who presents today for a complete physical exam. She reports consuming a general diet. She walks three times a week for about one hour at a time. She generally feels well. She reports sleeping poorly d/t having to care for her mother-in-law with COPD during the night. She does have additional problems to discuss today. She is concerned about the appearance of a new freckle on her right ear and would like a referral to Dermatology. She is also complaining of "pulsing" in the back of her head when she lays down at night. She denies this occurring during the day, dizziness, headaches, lightheadedness, or palpitations.   Most recent fall risk assessment:    11/23/2022    3:14 PM  Fall Risk   Falls in the past year? 0  Number falls in past yr: 0  Injury with Fall? 0  Risk for fall due to : No Fall Risks  Follow up Falls evaluation completed     Most recent depression screenings:    11/23/2022    3:14 PM 08/11/2021    4:49 PM  PHQ 2/9 Scores  PHQ - 2 Score 0 0  PHQ- 9 Score  7    Vision:Within last year, Dental: No current dental problems and Receives regular dental care, and STD: The patient denies history of sexually transmitted disease.  Patient Active Problem List   Diagnosis Date Noted   Palpitations 08/12/2022   Gallstones 06/15/2022   Epigastric pain determined by examination 07/29/2018   Nausea 07/29/2018   History of urinary tract infection 07/29/2018   Birth control 01/14/2016   History of anemia 01/14/2016   Family history of cardiac disorder 01/14/2016   Past Medical History:  Diagnosis Date   GERD (gastroesophageal reflux disease)    Past Surgical History:  Procedure Laterality Date   DILATION AND CURETTAGE  OF UTERUS  2015   Social History   Tobacco Use   Smoking status: Never   Smokeless tobacco: Never  Vaping Use   Vaping status: Never Used  Substance Use Topics   Alcohol use: Not Currently   Drug use: Never   Family History  Problem Relation Age of Onset   Atrial fibrillation Mother    Hyperlipidemia Father    Hypertension Father    Heart attack Father    Lymphoma Maternal Grandmother    Dementia Maternal Grandfather    Diabetes Paternal Grandmother    Stroke Paternal Grandmother    Heart attack Paternal Grandmother    Cancer Paternal Grandfather        LIVER   Heart attack Paternal Grandfather    Colon cancer Maternal Uncle    Stomach cancer Paternal Uncle    Esophageal cancer Paternal Uncle    Pancreatic cancer Neg Hx    Rectal cancer Neg Hx    Allergies  Allergen Reactions   Ciprofloxacin Other (See Comments)    Headache       Patient Care Team: Nolene Ebbs as PCP - General (Family Medicine)   Outpatient Medications Prior to Visit  Medication Sig   [DISCONTINUED] dextromethorphan-guaiFENesin (MUCINEX DM) 30-600 MG 12hr tablet Take 1 tablet by mouth 2 (two) times daily.  No facility-administered medications prior to visit.    Review of Systems  All other systems reviewed and are negative.     Objective:     BP 122/76   Pulse 93   Ht 5\' 6"  (1.676 m)   Wt 80.3 kg   SpO2 99%   BMI 28.57 kg/m  BP Readings from Last 3 Encounters:  08/16/23 122/76  06/28/23 123/82  04/22/23 127/83   Wt Readings from Last 3 Encounters:  08/16/23 80.3 kg  11/23/22 72.1 kg  11/20/22 76.7 kg    Physical Exam Constitutional:      Appearance: Normal appearance.  HENT:     Head: Normocephalic.     Right Ear: Tympanic membrane normal.     Left Ear: Tympanic membrane normal.  Neck:     Vascular: No carotid bruit.  Cardiovascular:     Rate and Rhythm: Normal rate and regular rhythm.     Pulses: Normal pulses.     Heart sounds: Normal heart sounds.   Pulmonary:     Effort: Pulmonary effort is normal.     Breath sounds: Normal breath sounds.  Abdominal:     General: Abdomen is flat. Bowel sounds are normal.     Palpations: Abdomen is soft.     Tenderness: There is abdominal tenderness.  Musculoskeletal:     Cervical back: Neck supple. No tenderness.     Right lower leg: No edema.     Left lower leg: No edema.  Skin:    General: Skin is warm and dry.  Neurological:     General: No focal deficit present.     Mental Status: She is alert and oriented to person, place, and time.  Psychiatric:        Mood and Affect: Mood normal.        Behavior: Behavior normal.      No results found for any visits on 08/16/23. {Show previous labs (optional):23779}    Assessment & Plan:    Amiree was seen today for annual exam.  Diagnoses and all orders for this visit:  Routine physical examination -     Lipid panel -     CMP14+EGFR -     TSH -     CBC w/Diff/Platelet -     VITAMIN D 25 Hydroxy (Vit-D Deficiency, Fractures) -     B12 and Folate Panel  Medication management -     Lipid panel -     CMP14+EGFR -     TSH -     CBC w/Diff/Platelet -     VITAMIN D 25 Hydroxy (Vit-D Deficiency, Fractures) -     B12 and Folate Panel  Screening for diabetes mellitus -     CMP14+EGFR  Screening for lipid disorders -     Lipid panel  Other orders -     clonazePAM (KLONOPIN) 0.25 MG disintegrating tablet; Take 1 tablet (0.25 mg total) by mouth 2 (two) times daily.   Fasting labs ordered  Klonopin prescription sent to pharmacy; use as needed for anxiety  Referral for dermatology for skin check  Use a cervical pillow and try side laying at night to help with head/neck discomfort  Pap smear to be scheduled  Covid and Flu vaccines declined     Brien Mates, [employer]

## 2023-08-17 ENCOUNTER — Encounter: Payer: Self-pay | Admitting: Physician Assistant

## 2023-08-17 DIAGNOSIS — R239 Unspecified skin changes: Secondary | ICD-10-CM | POA: Insufficient documentation

## 2023-08-17 DIAGNOSIS — L814 Other melanin hyperpigmentation: Secondary | ICD-10-CM | POA: Insufficient documentation

## 2023-08-17 DIAGNOSIS — F419 Anxiety disorder, unspecified: Secondary | ICD-10-CM | POA: Insufficient documentation

## 2023-08-17 DIAGNOSIS — G8929 Other chronic pain: Secondary | ICD-10-CM | POA: Insufficient documentation

## 2023-08-18 ENCOUNTER — Encounter: Payer: Self-pay | Admitting: Physician Assistant

## 2023-09-13 ENCOUNTER — Ambulatory Visit: Payer: BC Managed Care – PPO | Admitting: Physician Assistant

## 2023-10-13 ENCOUNTER — Ambulatory Visit: Payer: Self-pay | Admitting: Physician Assistant

## 2023-10-13 ENCOUNTER — Ambulatory Visit
Admission: RE | Admit: 2023-10-13 | Discharge: 2023-10-13 | Disposition: A | Discharge status: Home / Self Care | Source: Ambulatory Visit | Attending: Family Medicine | Admitting: Family Medicine

## 2023-10-13 VITALS — BP 116/82 | HR 82 | Temp 98.0°F | Resp 16

## 2023-10-13 DIAGNOSIS — R109 Unspecified abdominal pain: Secondary | ICD-10-CM | POA: Diagnosis not present

## 2023-10-13 DIAGNOSIS — Z87442 Personal history of urinary calculi: Secondary | ICD-10-CM

## 2023-10-13 LAB — POCT URINALYSIS DIP (MANUAL ENTRY)
Bilirubin, UA: NEGATIVE
Blood, UA: NEGATIVE
Glucose, UA: NEGATIVE mg/dL
Ketones, POC UA: NEGATIVE mg/dL
Leukocytes, UA: NEGATIVE
Nitrite, UA: NEGATIVE
Protein Ur, POC: NEGATIVE mg/dL
Spec Grav, UA: 1.025 (ref 1.010–1.025)
Urobilinogen, UA: 0.2 U/dL — AB
pH, UA: 6.5 (ref 5.0–8.0)

## 2023-10-13 LAB — POCT URINE PREGNANCY: Preg Test, Ur: NEGATIVE

## 2023-10-13 NOTE — ED Provider Notes (Signed)
[facility]    CSN: [id number] Arrival date & time: 10/13/23  1642      History   Chief Complaint Chief Complaint  Patient presents with   Back Pain    HPI Cindy Dunlap is a 33 y.o. female.   HPI  Patient has a history of kidney stones.  I reviewed the old chart and found a CT dated 09/09/2015 which documented kidney stone.  Since that time she has had multiple visits for flank pain.  Here for flank pain today with urinary frequency.  Concern for stone.  No nausea or vomiting.  No fever or chills.  No hematuria noted.  Past Medical History:  Diagnosis Date   GERD (gastroesophageal reflux disease)     Patient Active Problem List   Diagnosis Date Noted   History of kidney stones 10/13/2023   Chronic neck pain 08/17/2023   Recent skin changes 08/17/2023   Solar lentigo 08/17/2023   Anxiety 08/17/2023   Palpitations 08/12/2022   Gallstones 06/15/2022   Epigastric pain determined by examination 07/29/2018   Nausea 07/29/2018   History of urinary tract infection 07/29/2018   Birth control 01/14/2016   History of anemia 01/14/2016   Family history of cardiac disorder 01/14/2016    Past Surgical History:  Procedure Laterality Date   DILATION AND CURETTAGE OF UTERUS  2015    OB History   No obstetric history on file.      Home Medications    Prior to Admission medications   Medication Sig Start Date End Date Taking? Authorizing Provider  clonazePAM (KLONOPIN) 0.25 MG disintegrating tablet Take 1 tablet (0.25 mg total) by mouth 2 (two) times daily. 08/16/23   Jomarie Longs, PA-C    Family History Family History  Problem Relation Age of Onset   Atrial fibrillation Mother    Hyperlipidemia Father    Hypertension Father    Heart attack Father    Lymphoma Maternal Grandmother    Dementia Maternal Grandfather    Diabetes Paternal Grandmother    Stroke Paternal Grandmother    Heart attack Paternal Grandmother    Cancer Paternal Grandfather         LIVER   Heart attack Paternal Grandfather    Colon cancer Maternal Uncle    Stomach cancer Paternal Uncle    Esophageal cancer Paternal Uncle    Pancreatic cancer Neg Hx    Rectal cancer Neg Hx     Social History Social History   Tobacco Use   Smoking status: Never   Smokeless tobacco: Never  Vaping Use   Vaping status: Never Used  Substance Use Topics   Alcohol use: Not Currently   Drug use: Never     Allergies   Ciprofloxacin   Review of Systems Review of Systems See HPI  Physical Exam Triage Vital Signs ED Triage Vitals [10/13/23 1649]  Encounter Vitals Group     BP 116/82     Systolic BP Percentile      Diastolic BP Percentile      Pulse Rate 82     Resp 16     Temp 98 F (36.7 C)     Temp Source Oral     SpO2 100 %     Weight      Height      Head Circumference      Peak Flow      Pain Score 6     Pain Loc      Pain Education  Exclude from Growth Chart    No data found.  Updated Vital Signs BP 116/82 (BP Location: Left Arm)   Pulse 82   Temp 98 F (36.7 C) (Oral)   Resp 16   LMP 09/22/2023 (Approximate)   SpO2 100%       Physical Exam Constitutional:      General: She is not in acute distress.    Appearance: She is well-developed.  HENT:     Head: Normocephalic and atraumatic.  Eyes:     Conjunctiva/sclera: Conjunctivae normal.     Pupils: Pupils are equal, round, and reactive to light.  Cardiovascular:     Rate and Rhythm: Normal rate.  Pulmonary:     Effort: Pulmonary effort is normal. No respiratory distress.  Abdominal:     General: There is no distension.     Palpations: Abdomen is soft.     Tenderness: There is no abdominal tenderness. There is right CVA tenderness.  Musculoskeletal:        General: Normal range of motion.     Cervical back: Normal range of motion.     Comments: Mild tenderness in the right lumbar and right SI region.  Acute tenderness to palpation of the CVA area  Skin:    General: Skin is warm  and dry.  Neurological:     Mental Status: She is alert.      UC Treatments / Results  Labs (all labs ordered are listed, but only abnormal results are displayed) Labs Reviewed  POCT URINALYSIS DIP (MANUAL ENTRY) - Abnormal; Notable for the following components:      Result Value   Urobilinogen, UA 0.2 (*)    All other components within normal limits  POCT URINE PREGNANCY - Normal  URINE CULTURE    EKG   Radiology No results found.  Procedures Procedures (including critical care time)  Medications Ordered in UC Medications - No data to display  Initial Impression / Assessment and Plan / UC Course  I have reviewed the triage vital signs and the nursing notes.  Pertinent labs & imaging results that were available during my care of the patient were reviewed by me and considered in my medical decision making (see chart for details).     I explained to the patient although it is possible to have a kidney stone with no hematuria it is not as likely.  She may have musculoskeletal pain.  She does have Flomax at home from her last kidney stone as well as a P strainer.  We will give her a container in case she does catch a stone.  Tylenol or ibuprofen for pain.  Heating pad and rest.  Return as needed Urine is being sent for culture Final Clinical Impressions(s) / UC Diagnoses   Final diagnoses:  Flank pain, acute  History of kidney stones     Discharge Instructions      Increase your water intake Take the Flomax once a day for 2 weeks.  If you are unable to find your Flomax at home let me know and I will send some to the pharmacy Strain your urine until your symptoms have resolved If you catch a stone put it in a clean container for analysis Take Tylenol or ibuprofen for your back pain You might need to go to the emergency room if the pain becomes much worse     ED Prescriptions   None    PDMP not reviewed this encounter.   Eustace Moore, MD  10/13/23  1718  

## 2023-10-13 NOTE — Discharge Instructions (Addendum)
 Increase your water intake Take the Flomax once a day for 2 weeks.  If you are unable to find your Flomax at home let me know and I will send some to the pharmacy Strain your urine until your symptoms have resolved If you catch a stone put it in a clean container for analysis Take Tylenol or ibuprofen for your back pain You might need to go to the emergency room if the pain becomes much worse

## 2023-10-13 NOTE — Telephone Encounter (Signed)
  Chief Complaint: urinary frequency Symptoms: urinary frequency, back pain Frequency: since Monday Pertinent Negatives: Patient denies fever, blood in urine Disposition: [] ED /[x] Urgent Care (no appt availability in office) / [] Appointment(In office/virtual)/ []  [facility]/ [] Home Care/ [] Refused Recommended Disposition /[] [facility]/ []  Follow-up with PCP Additional Notes: Patient reports she has been experiencing lower back pain and urinary frequency since Monday. Patient denies blood in urine. States she has a hx of both kidney stones and kidney infections so believes it is likely one of those. Per protocol, attempted to schedule in office appt. No availability. Patient states she did not want to be seen at another PCP office, so this RN scheduled appt today for UC. Patient advised to call back with worsening symptoms. Patient verbalized understanding.    Copied from CRM [id number]. Topic: Clinical - Red Word Triage >> Oct 13, 2023 10:56 AM Nila Nephew wrote: Red Word that prompted transfer to Nurse Triage: thinks has a kidney infection or something - back pain she's had before - frequent urination Reason for Disposition  Side (flank) or lower back pain present  Answer Assessment - Initial Assessment Questions 1. SYMPTOM: "What's the main symptom you're concerned about?" (e.g., frequency, incontinence)     frequency 2. ONSET: "When did the  frequency  start?"     monday 3. PAIN: "Is there any pain?" If Yes, ask: "How bad is it?" (Scale: 1-10; mild, moderate, severe)     severe 4. CAUSE: "What do you think is causing the symptoms?"     Kidney infection or kidney stones 5. OTHER SYMPTOMS: "Do you have any other symptoms?" (e.g., blood in urine, fever, flank pain, pain with urination)     flank pain  Protocols used: Urinary Symptoms-A-AH

## 2023-10-13 NOTE — ED Triage Notes (Signed)
 Pt with lower back pain that has worsened since Monday night. States she has hx of kidney stones. Patient also with frequent urination.

## 2023-10-14 ENCOUNTER — Telehealth: Admitting: Physician Assistant

## 2023-10-14 ENCOUNTER — Ambulatory Visit: Payer: Self-pay

## 2023-10-14 DIAGNOSIS — R590 Localized enlarged lymph nodes: Secondary | ICD-10-CM

## 2023-10-14 DIAGNOSIS — H6692 Otitis media, unspecified, left ear: Secondary | ICD-10-CM

## 2023-10-14 MED ORDER — FLUTICASONE PROPIONATE 50 MCG/ACT NA SUSP
2.0000 | Freq: Every day | NASAL | 0 refills | Status: AC
Start: 2023-10-14 — End: ?

## 2023-10-14 MED ORDER — AMOXICILLIN-POT CLAVULANATE 875-125 MG PO TABS: 1.0000 | ORAL_TABLET | Freq: Two times a day (BID) | ORAL | 0 refills | Status: DC

## 2023-10-14 NOTE — Telephone Encounter (Signed)
  Chief Complaint: swollen lymph node Symptoms: swelling, L ear pain, runny nose Frequency: began this morning around 0800 Pertinent Negatives: Patient denies fever, difficulty swallowing Disposition: [] ED /[] Urgent Care (no appt availability in office) / [] Appointment(In office/virtual)/ []  [facility]/ [] Home Care/ [] Refused Recommended Disposition /[] [facility]/ []  Follow-up with PCP Additional Notes: Patient calls reporting swollen lymph node under jaw, states it is painful to talk. Patient reports she also feels she has increased allergies, ear fullness, runny nose. Per protocol, patient to be evaluated within 24 hours. Patient requests virtual urgent care visit for today, states she is unable to come into clinic. Patient scheduled for 1430 today. Care advice reviewed, patient verbalized understanding and denies further questions at this time. Alerting PCP for review.    Copied from CRM [id number]. Topic: Clinical - Red Word Triage >> Oct 14, 2023 10:47 AM Nila Nephew wrote: Red Word that prompted transfer to Nurse Triage: Overnight appearance of swollen lymph node on neck, making it hard to talk.  Thinks may be attributed to excessive pollen. Reason for Disposition  [1] Single large node AND [2] size > 1 inch (2.5 cm) AND [3] no fever  Answer Assessment - Initial Assessment Questions 1. LOCATION: "Where is the swollen node located?" "Is the matching node on the other side of the body also swollen?"      Under chin midline 2. SIZE: "How big is the node?" (e.g., inches or centimeters; or compared to common objects such as pea, bean, marble, golf ball)      A little bigger than gumball 3. ONSET: "When did the swelling start?"      This morning when waking- around 0800 4. NECK NODES: "Is there a sore throat, runny nose or other symptoms of a cold?"      L ear pain, runny nose 5. GROIN OR ARMPIT NODES: "Is there a sore, scratch, cut or painful red area on that arm or  leg?"      Denies 6. FEVER: "Do you have a fever?" If Yes, ask: "What is it, how was it measured, and when did it start?"      Denies 7. CAUSE: "What do you think is causing the swollen lymph nodes?"     Maybe allergies 8. OTHER SYMPTOMS: "Do you have any other symptoms?"     Allergy symptoms 9. PREGNANCY: "Is there any chance you are pregnant?" "When was your last menstrual period?"     Denies, LMP: 3 weeks ago  Protocols used: Lymph Nodes - Swollen-A-AH

## 2023-10-14 NOTE — Progress Notes (Signed)
 Virtual Visit Consent   Rhema Boyett, you are scheduled for a virtual visit with a [facility] provider today. Just as with appointments in the office, your consent must be obtained to participate. Your consent will be active for this visit and any virtual visit you may have with one of our providers in the next 365 days. If you have a MyChart account, a copy of this consent can be sent to you electronically.  As this is a virtual visit, video technology does not allow for your provider to perform a traditional examination. This may limit your provider's ability to fully assess your condition. If your provider identifies any concerns that need to be evaluated in person or the need to arrange testing (such as labs, EKG, etc.), we will make arrangements to do so. Although advances in technology are sophisticated, we cannot ensure that it will always work on either your end or our end. If the connection with a video visit is poor, the visit may have to be switched to a telephone visit. With either a video or telephone visit, we are not always able to ensure that we have a secure connection.  By engaging in this virtual visit, you consent to the provision of healthcare and authorize for your insurance to be billed (if applicable) for the services provided during this visit. Depending on your insurance coverage, you may receive a charge related to this service.  I need to obtain your verbal consent now. Are you willing to proceed with your visit today? Tristen Pennino has provided verbal consent on 10/14/2023 for a virtual visit (video or telephone). Piedad Climes, [place]  Date: 10/14/2023 2:57 PM   Virtual Visit via Video Note   I, Piedad Climes, connected with  Cindy Dunlap  ([id number], 1991/01/14) on 10/14/23 at  2:30 PM EDT by a video-enabled telemedicine application and verified that I am speaking with the correct person using two identifiers.  Location: Patient: Virtual Visit Location  Patient: Home Provider: Virtual Visit Location Provider: Home Office   I discussed the limitations of evaluation and management by telemedicine and the availability of in person appointments. The patient expressed understanding and agreed to proceed.    History of Present Illness: Cindy Dunlap is a 33 y.o. who identifies as a female who was assigned female at birth, and is being seen today for some nasal congestion with L ear pressure and now consistent pain, also with tender left-sided swollen lymph node on her neck, first noted this AM. Denies fever, chills. Some fatigue. Notes ear pain is significant. Denies change in hearing or drainage from ear. Denies symptoms of R ear.  HPI: HPI  Problems:  Patient Active Problem List   Diagnosis Date Noted   History of kidney stones 10/13/2023   Chronic neck pain 08/17/2023   Recent skin changes 08/17/2023   Solar lentigo 08/17/2023   Anxiety 08/17/2023   Palpitations 08/12/2022   Gallstones 06/15/2022   Epigastric pain determined by examination 07/29/2018   Nausea 07/29/2018   History of urinary tract infection 07/29/2018   Birth control 01/14/2016   History of anemia 01/14/2016   Family history of cardiac disorder 01/14/2016    Allergies:  Allergies  Allergen Reactions   Ciprofloxacin Other (See Comments)    Headache    Medications:  Current Outpatient Medications:    amoxicillin-clavulanate (AUGMENTIN) 875-125 MG tablet, Take 1 tablet by mouth 2 (two) times daily., Disp: 14 tablet, Rfl: 0   fluticasone (FLONASE) 50 MCG/ACT nasal spray,  Place 2 sprays into both nostrils daily., Disp: 16 g, Rfl: 0   clonazePAM (KLONOPIN) 0.25 MG disintegrating tablet, Take 1 tablet (0.25 mg total) by mouth 2 (two) times daily., Disp: 30 tablet, Rfl: 0  Observations/Objective: Patient is well-developed, well-nourished in no acute distress.  Resting comfortably  at home.  Head is normocephalic, atraumatic.  No labored breathing.  Speech is clear  and coherent with logical content.  Patient is alert and oriented at baseline.  Visible left anterior submandibular adenopathy.  Assessment and Plan: 1. Left otitis media, unspecified otitis media type (Primary) - fluticasone (FLONASE) 50 MCG/ACT nasal spray; Place 2 sprays into both nostrils daily.  Dispense: 16 g; Refill: 0 - amoxicillin-clavulanate (AUGMENTIN) 875-125 MG tablet; Take 1 tablet by mouth 2 (two) times daily.  Dispense: 14 tablet; Refill: 0  2. Cervical adenopathy - amoxicillin-clavulanate (AUGMENTIN) 875-125 MG tablet; Take 1 tablet by mouth 2 (two) times daily.  Dispense: 14 tablet; Refill: 0  Augmentin per orders. Flonase added on to her allergy regimen. Supportive measures and OTC medications reviewed. Follow-up with PCP if not resolving, or for any new or worsening symptoms.  Follow Up Instructions: I discussed the assessment and treatment plan with the patient. The patient was provided an opportunity to ask questions and all were answered. The patient agreed with the plan and demonstrated an understanding of the instructions.  A copy of instructions were sent to the patient via MyChart unless otherwise noted below.   The patient was advised to call back or seek an in-person evaluation if the symptoms worsen or if the condition fails to improve as anticipated.    Piedad Climes, PA-C

## 2023-10-14 NOTE — Patient Instructions (Addendum)
  Sheral Flow, thank you for joining Piedad Climes, PA-C for today's virtual visit.  While this provider is not your primary care provider (PCP), if your PCP is located in our provider database this encounter information will be shared with them immediately following your visit.   A [facility] MyChart account gives you access to today's visit and all your visits, tests, and labs performed at [facility] " click here if you don't have a [facility] MyChart account or go to [email or web address]  Consent: (Patient) Cindy Dunlap provided verbal consent for this virtual visit at the beginning of the encounter.  Current Medications:  Current Outpatient Medications:    amoxicillin-clavulanate (AUGMENTIN) 875-125 MG tablet, Take 1 tablet by mouth 2 (two) times daily., Disp: 14 tablet, Rfl: 0   fluticasone (FLONASE) 50 MCG/ACT nasal spray, Place 2 sprays into both nostrils daily., Disp: 16 g, Rfl: 0   clonazePAM (KLONOPIN) 0.25 MG disintegrating tablet, Take 1 tablet (0.25 mg total) by mouth 2 (two) times daily., Disp: 30 tablet, Rfl: 0   Medications ordered in this encounter:  Meds ordered this encounter  Medications   fluticasone (FLONASE) 50 MCG/ACT nasal spray    Sig: Place 2 sprays into both nostrils daily.    Dispense:  16 g    Refill:  0    Supervising Provider:   Merrilee Jansky [[id number]]   amoxicillin-clavulanate (AUGMENTIN) 875-125 MG tablet    Sig: Take 1 tablet by mouth 2 (two) times daily.    Dispense:  14 tablet    Refill:  0    Supervising Provider:   Merrilee Jansky [[id number]]     *If you need refills on other medications prior to your next appointment, please contact your pharmacy*  Follow-Up: Call back or seek an in-person evaluation if the symptoms worsen or if the condition fails to improve as anticipated.  [facility] [phone]  Other Instructions Please keep hydrated and rest. Start a saline nasal rinse.  Use the  Flonase once daily as directed. Start the antibiotic, taking as directed, with food. You can use OTC Tylenol and Ibuprofen as needed. Follow-up if symptoms are not resolving, or if you are noting any new/worsening symptoms despite treatment.    If you have been instructed to have an in-person evaluation today at a local Urgent Care facility, please use the link below. It will take you to a list of all of our available [facility], including address, phone number and hours of operation. Please do not delay care.  [facility]  If you or a family member do not have a primary care provider, use the link below to schedule a visit and establish care. When you choose a [facility] primary care physician or advanced practice provider, you gain a long-term partner in health. Find a Primary Care Provider  Learn more about [facility]'s in-office and virtual care options: [facility] - Get Care Now

## 2023-10-15 LAB — URINE CULTURE
Culture: <10000 — AB
Special Requests: NORMAL

## 2023-10-18 ENCOUNTER — Ambulatory Visit: Payer: Self-pay

## 2023-10-18 ENCOUNTER — Other Ambulatory Visit: Payer: Self-pay

## 2023-10-18 ENCOUNTER — Ambulatory Visit
Admission: RE | Admit: 2023-10-18 | Discharge: 2023-10-18 | Disposition: A | Discharge status: Home / Self Care | Source: Ambulatory Visit | Attending: Family Medicine | Admitting: Family Medicine

## 2023-10-18 VITALS — BP 116/78 | HR 77 | Temp 98.2°F | Resp 16

## 2023-10-18 DIAGNOSIS — G44209 Tension-type headache, unspecified, not intractable: Secondary | ICD-10-CM | POA: Diagnosis not present

## 2023-10-18 HISTORY — DX: Anxiety disorder, unspecified: F41.9

## 2023-10-18 MED ORDER — CYCLOBENZAPRINE HCL 5 MG PO TABS: ORAL_TABLET | ORAL | 0 refills | Status: DC

## 2023-10-18 NOTE — Telephone Encounter (Signed)
 Patient being seen at [facility] today for separate issue.

## 2023-10-18 NOTE — Discharge Instructions (Addendum)
 Take the muscle relaxer at bedtime Continue taking your Advil as needed for pain Use ice or heat to the painful neck and shoulder muscles See your PCP in follow-up if you fail to improve

## 2023-10-18 NOTE — ED Triage Notes (Addendum)
 Pulsating headaches since Friday, which come and go. Lasts 1-5 minutes at a time. Feels a lot of pressure in head. Has been taking amoxicillin for swollen lymph node. Has been taking ibuprofen for headaches. C/o exhaustion.

## 2023-10-18 NOTE — Telephone Encounter (Signed)
 Copied from CRM [id number]. Topic: Clinical - Red Word Triage >> Oct 18, 2023  1:56 PM Ivette P wrote: [facility] that prompted transfer to Nurse Triage: Overnight appearance of swollen lymph node on neck, making it hard to talk.  Thinks may be attributed to excessive pollen.  Pt went to UC on 03/19 - pt is still experiencing symptoms  Chief Complaint: headache Symptoms: head feels like head is pulsating 7/10 - discomfort Frequency: 10/14/2023 Pertinent Negatives: Patient denies blurred vision Disposition: [] ED /[x] Urgent Care (no appt availability in office) / [] Appointment(In office/virtual)/ []  [facility]/ [] Home Care/ [] Refused Recommended Disposition /[] [facility]/ []  Follow-up with PCP Additional Notes: pt states changing any/all positions makes it happens and lasts for 1 to 5 minutes.  Also states when she looks left or right head begins to pulsate as well. Referred to urgent care due to pt could only schedule appt after 3:30 pm.  Reason for Disposition  [1] MODERATE headache (e.g., interferes with normal activities) AND [2] present > 24 hours AND [3] unexplained  (Exceptions: analgesics not tried, typical migraine, or headache part of viral illness)  Answer Assessment - Initial Assessment Questions 1. LOCATION: "Where does it hurt?"      Head - all over head 2. ONSET: "When did the headache start?" (Minutes, hours or days)      10/14/2023 3. PATTERN: "Does the pain come and go, or has it been constant since it started?"     Comes and goes 4. SEVERITY: "How bad is the pain?" and "What does it keep you from doing?"  (e.g., Scale 1-10; mild, moderate, or severe)   - MILD (1-3): doesn't interfere with normal activities    - MODERATE (4-7): interferes with normal activities or awakens from sleep    - SEVERE (8-10): excruciating pain, unable to do any normal activities        6/10 5. RECURRENT SYMPTOM: "Have you ever had headaches before?" If Yes, ask: "When was  the last time?" and "What happened that time?"      no 6. CAUSE: "What do you think is causing the headache?"     unknown 7. MIGRAINE: "Have you been diagnosed with migraine headaches?" If Yes, ask: "Is this headache similar?"      no 8. HEAD INJURY: "Has there been any recent injury to the head?"      no 9. OTHER SYMPTOMS: "Do you have any other symptoms?" (fever, stiff neck, eye pain, sore throat, cold symptoms)     Stiffness in neck, stiffness in shoulders but more on left 10. PREGNANCY: "Is there any chance you are pregnant?" "When was your last menstrual period?"       no  Protocols used: Headache-A-AH

## 2023-10-18 NOTE — Telephone Encounter (Signed)
 Patient being seen at [facility] today

## 2023-10-18 NOTE — ED Provider Notes (Signed)
 [facility]    CSN: [id number] Arrival date & time: 10/18/23  1801      History   Chief Complaint Chief Complaint  Patient presents with   Headache    Entered by patient    HPI Cindy Dunlap is a 33 y.o. female.   [name] is here for headache.  She states that she had a video visit on 10/14/2022 for swollen gland and ear pain.  She was diagnosed with a ear infection and cervical lymphadenitis.  She was treated with Augmentin and Flonase.  She states that these problems are much better.  Over the last couple of days, however, she has had a persistent headache.  She states that it waxes and wanes.  She has been taking ibuprofen.  It is all across the top of her head.  No sinus symptoms or congestion.  No trauma.  No history of migraines.  No visual symptoms.  No nausea or vomiting.  She does admit that she is under stress.  She also admits that she has bad postures and chronic pain in her neck and upper back    Past Medical History:  Diagnosis Date   Anxiety    GERD (gastroesophageal reflux disease)     Patient Active Problem List   Diagnosis Date Noted   History of kidney stones 10/13/2023   Chronic neck pain 08/17/2023   Recent skin changes 08/17/2023   Solar lentigo 08/17/2023   Anxiety 08/17/2023   Palpitations 08/12/2022   Gallstones 06/15/2022   Epigastric pain determined by examination 07/29/2018   Nausea 07/29/2018   History of urinary tract infection 07/29/2018   Birth control 01/14/2016   History of anemia 01/14/2016   Family history of cardiac disorder 01/14/2016    Past Surgical History:  Procedure Laterality Date   DILATION AND CURETTAGE OF UTERUS  2015    OB History   No obstetric history on file.      Home Medications    Prior to Admission medications   Medication Sig Start Date End Date Taking? Authorizing Provider  cyclobenzaprine (FLEXERIL) 5 MG tablet Take 1 tablet 30 minutes before bed.  May repeat x 1 if needed 10/18/23  Yes  Eustace Moore, MD  ibuprofen (ADVIL) 800 MG tablet Take 800 mg by mouth every 8 (eight) hours as needed.   Yes [provider]  amoxicillin-clavulanate (AUGMENTIN) 875-125 MG tablet Take 1 tablet by mouth 2 (two) times daily. 10/14/23   Waldon Merl, PA-C  clonazePAM (KLONOPIN) 0.25 MG disintegrating tablet Take 1 tablet (0.25 mg total) by mouth 2 (two) times daily. 08/16/23   Breeback, Jade L, PA-C  fluticasone (FLONASE) 50 MCG/ACT nasal spray Place 2 sprays into both nostrils daily. 10/14/23   Waldon Merl, PA-C    Family History Family History  Problem Relation Age of Onset   Atrial fibrillation Mother    Hyperlipidemia Father    Hypertension Father    Heart attack Father    Lymphoma Maternal Grandmother    Dementia Maternal Grandfather    Diabetes Paternal Grandmother    Stroke Paternal Grandmother    Heart attack Paternal Grandmother    Cancer Paternal Grandfather        LIVER   Heart attack Paternal Grandfather    Colon cancer Maternal Uncle    Stomach cancer Paternal Uncle    Esophageal cancer Paternal Uncle    Pancreatic cancer Neg Hx    Rectal cancer Neg Hx     Social History  Social History   Tobacco Use   Smoking status: Never   Smokeless tobacco: Never  Vaping Use   Vaping status: Never Used  Substance Use Topics   Alcohol use: Not Currently   Drug use: Never     Allergies   Ciprofloxacin   Review of Systems Review of Systems See HPI  Physical Exam Triage Vital Signs ED Triage Vitals  Encounter Vitals Group     BP 10/18/23 1804 116/78     Systolic BP Percentile --      Diastolic BP Percentile --      Pulse Rate 10/18/23 1804 77     Resp 10/18/23 1804 16     Temp 10/18/23 1804 98.2 F (36.8 C)     Temp src --      SpO2 10/18/23 1804 100 %     Weight --      Height --      Head Circumference --      Peak Flow --      Pain Score 10/18/23 1806 4     Pain Loc --      Pain Education --      Exclude from Growth Chart --     No data found.  Updated Vital Signs BP 116/78   Pulse 77   Temp 98.2 F (36.8 C)   Resp 16   LMP 09/27/2023 (Approximate)   SpO2 100%       Physical Exam Constitutional:      General: She is not in acute distress.    Appearance: She is well-developed.  HENT:     Head: Normocephalic and atraumatic.  Eyes:     Extraocular Movements: Extraocular movements intact.     Right eye: Normal extraocular motion and no nystagmus.     Left eye: Normal extraocular motion and no nystagmus.     Conjunctiva/sclera: Conjunctivae normal.     Pupils: Pupils are equal, round, and reactive to light.     Comments: Discs are flat  Neck:     Comments: Slow but full range of motion of neck.  Tenderness in the paraspinous and upper body trapezius muscles Cardiovascular:     Rate and Rhythm: Normal rate and regular rhythm.  Pulmonary:     Effort: Pulmonary effort is normal. No respiratory distress.     Breath sounds: Normal breath sounds.  Abdominal:     General: There is no distension.     Palpations: Abdomen is soft.  Musculoskeletal:        General: Normal range of motion.     Cervical back: Normal range of motion and neck supple.  Skin:    General: Skin is warm and dry.  Neurological:     Mental Status: She is alert.  Psychiatric:        Mood and Affect: Mood normal.      UC Treatments / Results  Labs (all labs ordered are listed, but only abnormal results are displayed) Labs Reviewed - No data to display  EKG   Radiology No results found.  Procedures Procedures (including critical care time)  Medications Ordered in UC Medications - No data to display  Initial Impression / Assessment and Plan / UC Course  I have reviewed the triage vital signs and the nursing notes.  Pertinent labs & imaging results that were available during my care of the patient were reviewed by me and considered in my medical decision making (see chart for details).      Final  Clinical  Impressions(s) / UC Diagnoses   Final diagnoses:  Muscle tension headache     Discharge Instructions      Take the muscle relaxer at bedtime Continue taking your Advil as needed for pain Use ice or heat to the painful neck and shoulder muscles See your PCP in follow-up if you fail to improve    ED Prescriptions     Medication Sig Dispense Auth. Provider   cyclobenzaprine (FLEXERIL) 5 MG tablet Take 1 tablet 30 minutes before bed.  May repeat x 1 if needed 20 tablet Eustace Moore, MD      PDMP not reviewed this encounter.   Eustace Moore, MD 10/18/23 [phone]

## 2023-10-20 NOTE — Addendum Note (Signed)
 Addended by: Waldon Merl on: 10/20/2023 02:29 PM   Modules accepted: Level of Service

## 2023-11-11 DIAGNOSIS — B354 Tinea corporis: Secondary | ICD-10-CM | POA: Diagnosis not present

## 2023-11-11 DIAGNOSIS — K047 Periapical abscess without sinus: Secondary | ICD-10-CM | POA: Diagnosis not present

## 2023-11-16 DIAGNOSIS — Z0000 Encounter for general adult medical examination without abnormal findings: Secondary | ICD-10-CM | POA: Diagnosis not present

## 2023-11-16 DIAGNOSIS — Z13220 Encounter for screening for lipoid disorders: Secondary | ICD-10-CM | POA: Diagnosis not present

## 2023-11-16 DIAGNOSIS — Z131 Encounter for screening for diabetes mellitus: Secondary | ICD-10-CM | POA: Diagnosis not present

## 2023-11-16 DIAGNOSIS — Z79899 Other long term (current) drug therapy: Secondary | ICD-10-CM | POA: Diagnosis not present

## 2023-11-17 ENCOUNTER — Other Ambulatory Visit: Payer: Self-pay | Admitting: Physician Assistant

## 2023-11-17 ENCOUNTER — Encounter: Payer: Self-pay | Admitting: Physician Assistant

## 2023-11-17 DIAGNOSIS — E538 Deficiency of other specified B group vitamins: Secondary | ICD-10-CM | POA: Insufficient documentation

## 2023-11-17 DIAGNOSIS — E559 Vitamin D deficiency, unspecified: Secondary | ICD-10-CM | POA: Insufficient documentation

## 2023-11-17 LAB — CBC WITH DIFFERENTIAL/PLATELET
Basophils Absolute: 0 10*3/uL (ref 0.0–0.2)
Basos: 0 %
EOS (ABSOLUTE): 0 10*3/uL (ref 0.0–0.4)
Eos: 1 %
Hematocrit: 39.8 % (ref 34.0–46.6)
Hemoglobin: 13.1 g/dL (ref 11.1–15.9)
Immature Grans (Abs): 0 10*3/uL (ref 0.0–0.1)
Immature Granulocytes: 0 %
Lymphocytes Absolute: 1.3 10*3/uL (ref 0.7–3.1)
Lymphs: 25 %
MCH: 29.7 pg (ref 26.6–33.0)
MCHC: 32.9 g/dL (ref 31.5–35.7)
MCV: 90 fL (ref 79–97)
Monocytes Absolute: 0.5 10*3/uL (ref 0.1–0.9)
Monocytes: 9 %
Neutrophils Absolute: 3.3 10*3/uL (ref 1.4–7.0)
Neutrophils: 65 %
Platelets: 213 10*3/uL (ref 150–450)
RBC: 4.41 x10E6/uL (ref 3.77–5.28)
RDW: 11.9 % (ref 11.7–15.4)
WBC: 5.1 10*3/uL (ref 3.4–10.8)

## 2023-11-17 LAB — CMP14+EGFR
ALT: 8 IU/L (ref 0–32)
AST: 16 IU/L (ref 0–40)
Albumin: 4.5 g/dL (ref 3.9–4.9)
Alkaline Phosphatase: 68 IU/L (ref 44–121)
BUN/Creatinine Ratio: 9 (ref 9–23)
BUN: 9 mg/dL (ref 6–20)
Bilirubin Total: 0.4 mg/dL (ref 0.0–1.2)
CO2: 23 mmol/L (ref 20–29)
Calcium: 9.3 mg/dL (ref 8.7–10.2)
Chloride: 104 mmol/L (ref 96–106)
Creatinine, Ser: 0.96 mg/dL (ref 0.57–1.00)
Globulin, Total: 2.4 g/dL (ref 1.5–4.5)
Glucose: 91 mg/dL (ref 70–99)
Potassium: 4.1 mmol/L (ref 3.5–5.2)
Sodium: 139 mmol/L (ref 134–144)
Total Protein: 6.9 g/dL (ref 6.0–8.5)
eGFR: 81 mL/min/{1.73_m2} (ref >59)

## 2023-11-17 LAB — LIPID PANEL
Chol/HDL Ratio: 4.4 ratio (ref 0.0–4.4)
Cholesterol, Total: 172 mg/dL (ref 100–199)
HDL: 39 mg/dL — ABNORMAL LOW (ref >39)
LDL Chol Calc (NIH): 111 mg/dL — ABNORMAL HIGH (ref 0–99)
Triglycerides: 120 mg/dL (ref 0–149)
VLDL Cholesterol Cal: 22 mg/dL (ref 5–40)

## 2023-11-17 LAB — VITAMIN D 25 HYDROXY (VIT D DEFICIENCY, FRACTURES): Vit D, 25-Hydroxy: 21.6 ng/mL — ABNORMAL LOW (ref 30.0–100.0)

## 2023-11-17 LAB — B12 AND FOLATE PANEL
Folate: 3.4 ng/mL (ref >3.0)
Vitamin B-12: 208 pg/mL — ABNORMAL LOW (ref 232–1245)

## 2023-11-17 LAB — TSH: TSH: 1.69 u[IU]/mL (ref 0.450–4.500)

## 2023-11-17 NOTE — Progress Notes (Signed)
 Tanis,   Kidney, liver, glucose look good.  Thyroid  looks great.  Hemoglobin normal.  Cholesterol not optimal but looks good. Limit fried/fatty/processed foods and try to get 150 minutes of exercise a week.  Vitamin D  and B12 are LOW.  You need to start vitamin D  1000 units daily with dairy for better absorption and b12 1000mcg sublingual drops for next 3 months and then recheck!

## 2023-12-27 ENCOUNTER — Ambulatory Visit: Payer: Self-pay

## 2023-12-27 NOTE — Telephone Encounter (Signed)
 Copied from CRM [id number]. Topic: Clinical - Red Word Triage >> Dec 27, 2023 11:56 AM Tisa Forester wrote: Red Word that prompted transfer to Nurse Triage: shortness of Breath for about a month, its random comes and goes  Patient call back number  [phone]   Chief Complaint: Shortness of breath  Symptoms: Shortness of breath  Frequency: Intermittent  Pertinent Negatives: Patient denies any other symptoms  Disposition: [] ED /[] Urgent Care (no appt availability in office) / [x] Appointment(In office/virtual)/ []  [facility]/ [] Home Care/ [] Refused Recommended Disposition /[] [facility]/ []  Follow-up with PCP Additional Notes: Patient reports she has been experiencing intermittent mild shortness of breath for the last month. She states that there is no pattern to her shortness of breath and she believes it is due to anxiety. She denies any other symptoms or complaints. Appointment made for 6/9 based on the patient's availability. Patient instructed to call back for new or worsening symptoms. Patient verbalized understanding and agreement with this plan.    Reason for Disposition  [1] MILD difficulty breathing (e.g., minimal/no SOB at rest, SOB with walking, pulse <100) AND [2] NEW-onset or WORSE than normal    Intermittent, no shortness of breath now, appointment next week based on triage  Answer Assessment - Initial Assessment Questions 1. RESPIRATORY STATUS: "Describe your breathing?" (e.g., wheezing, shortness of breath, unable to speak, severe coughing)      Shortness of breath  2. ONSET: "When did this breathing problem begin?"      1 month  3. PATTERN "Does the difficult breathing come and go, or has it been constant since it started?"      Intermittent  4. SEVERITY: "How bad is your breathing?" (e.g., mild, moderate, severe)    - MILD: No SOB at rest, mild SOB with walking, speaks normally in sentences, can lie down, no retractions, pulse < 100.    - MODERATE: SOB at  rest, SOB with minimal exertion and prefers to sit, cannot lie down flat, speaks in phrases, mild retractions, audible wheezing, pulse 100-120.    - SEVERE: Very SOB at rest, speaks in single words, struggling to breathe, sitting hunched forward, retractions, pulse > 120      Mild 5. RECURRENT SYMPTOM: "Have you had difficulty breathing before?" If Yes, ask: "When was the last time?" and "What happened that time?"      No 6. CARDIAC HISTORY: "Do you have any history of heart disease?" (e.g., heart attack, angina, bypass surgery, angioplasty)      No 7. LUNG HISTORY: "Do you have any history of lung disease?"  (e.g., pulmonary embolus, asthma, emphysema)     No 8. CAUSE: "What do you think is causing the breathing problem?"      Believes it might be anxiety  9. OTHER SYMPTOMS: "Do you have any other symptoms? (e.g., dizziness, runny nose, cough, chest pain, fever)     No 10. O2 SATURATION MONITOR:  "Do you use an oxygen saturation monitor (pulse oximeter) at home?" If Yes, ask: "What is your reading (oxygen level) today?" "What is your usual oxygen saturation reading?" (e.g., 95%)       No 11. PREGNANCY: "Is there any chance you are pregnant?" "When was your last menstrual period?"       No 12. TRAVEL: "Have you traveled out of the country in the last month?" (e.g., travel history, exposures)       No  Protocols used: Breathing Difficulty-A-AH

## 2024-01-03 ENCOUNTER — Ambulatory Visit (INDEPENDENT_AMBULATORY_CARE_PROVIDER_SITE_OTHER): Admitting: Physician Assistant

## 2024-01-03 VITALS — BP 122/72 | HR 82 | Wt 177.0 lb

## 2024-01-03 DIAGNOSIS — E538 Deficiency of other specified B group vitamins: Secondary | ICD-10-CM

## 2024-01-03 DIAGNOSIS — R0602 Shortness of breath: Secondary | ICD-10-CM | POA: Diagnosis not present

## 2024-01-03 DIAGNOSIS — F419 Anxiety disorder, unspecified: Secondary | ICD-10-CM | POA: Diagnosis not present

## 2024-01-03 DIAGNOSIS — E559 Vitamin D deficiency, unspecified: Secondary | ICD-10-CM | POA: Diagnosis not present

## 2024-01-03 NOTE — Patient Instructions (Signed)
 Start ashwaganda 300mg  twice a day for anxiety Get labs today Use klonapin as needed  Generalized Anxiety Disorder, Adult Generalized anxiety disorder (GAD) is a mental health condition. Unlike normal worries, anxiety related to GAD is not triggered by a specific event. These worries do not fade or get better with time. GAD interferes with relationships, work, and school. GAD symptoms can vary from mild to severe. People with severe GAD can have intense waves of anxiety with physical symptoms that are similar to panic attacks. What are the causes? The exact cause of GAD is not known, but the following are believed to have an impact: Differences in natural brain chemicals. Genes passed down from parents to children. Differences in the way threats are perceived. Development and stress during childhood. Personality. What increases the risk? The following factors may make you more likely to develop this condition: Being female. Having a family history of anxiety disorders. Being very shy. Experiencing very stressful life events, such as the death of a loved one. Having a very stressful family environment. What are the signs or symptoms? People with GAD often worry excessively about many things in their lives, such as their health and family. Symptoms may also include: Mental and emotional symptoms: Worrying excessively about natural disasters. Fear of being late. Difficulty concentrating. Fears that others are judging your performance. Physical symptoms: Fatigue. Headaches, muscle tension, muscle twitches, trembling, or feeling shaky. Feeling like your heart is pounding or beating very fast. Feeling out of breath or like you cannot take a deep breath. Having trouble falling asleep or staying asleep, or experiencing restlessness. Sweating. Nausea, diarrhea, or irritable bowel syndrome (IBS). Behavioral symptoms: Experiencing erratic moods or irritability. Avoidance of new  situations. Avoidance of people. Extreme difficulty making decisions. How is this diagnosed? This condition is diagnosed based on your symptoms and medical history. You will also have a physical exam. Your health care provider may perform tests to rule out other possible causes of your symptoms. To be diagnosed with GAD, a person must have anxiety that: Is out of his or her control. Affects several different aspects of his or her life, such as work and relationships. Causes distress that makes him or her unable to take part in normal activities. Includes at least three symptoms of GAD, such as restlessness, fatigue, trouble concentrating, irritability, muscle tension, or sleep problems. Before your health care provider can confirm a diagnosis of GAD, these symptoms must be present more days than they are not, and they must last for 6 months or longer. How is this treated? This condition may be treated with: Medicine. Antidepressant medicine is usually prescribed for long-term daily control. Anti-anxiety medicines may be added in severe cases, especially when panic attacks occur. Talk therapy (psychotherapy). Certain types of talk therapy can be helpful in treating GAD by providing support, education, and guidance. Options include: Cognitive behavioral therapy (CBT). People learn coping skills and self-calming techniques to ease their physical symptoms. They learn to identify unrealistic thoughts and behaviors and to replace them with more appropriate thoughts and behaviors. Acceptance and commitment therapy (ACT). This treatment teaches people how to be mindful as a way to cope with unwanted thoughts and feelings. Biofeedback. This process trains you to manage your body's response (physiological response) through breathing techniques and relaxation methods. You will work with a therapist while machines are used to monitor your physical symptoms. Stress management techniques. These include yoga,  meditation, and exercise. A mental health specialist can help determine which treatment is  best for you. Some people see improvement with one type of therapy. However, other people require a combination of therapies. Follow these instructions at home: Lifestyle Maintain a consistent routine and schedule. Anticipate stressful situations. Create a plan and allow extra time to work with your plan. Practice stress management or self-calming techniques that you have learned from your therapist or your health care provider. Exercise regularly and spend time outdoors. Eat a healthy diet that includes plenty of vegetables, fruits, whole grains, low-fat dairy products, and lean protein. Do not eat a lot of foods that are high in fat, added sugar, or salt (sodium). Drink plenty of water. Avoid alcohol. Alcohol can increase anxiety. Avoid caffeine and certain over-the-counter cold medicines. These may make you feel worse. Ask your pharmacist which medicines to avoid. General instructions Take over-the-counter and prescription medicines only as told by your health care provider. Understand that you are likely to have setbacks. Accept this and be kind to yourself as you persist to take better care of yourself. Anticipate stressful situations. Create a plan and allow extra time to work with your plan. Recognize and accept your accomplishments, even if you judge them as small. Spend time with people who care about you. Keep all follow-up visits. This is important. Where to find more information [employer] of Mental [employer]: http://www.maynard.net/ Substance Abuse and Mental Health Services: [employer].pt Contact a health care provider if: Your symptoms do not get better. Your symptoms get worse. You have signs of depression, such as: A persistently sad or irritable mood. Loss of enjoyment in activities that used to bring you joy. Change in weight or eating. Changes in sleeping habits. Get help right  away if: You have thoughts about hurting yourself or others. If you ever feel like you may hurt yourself or others, or have thoughts about taking your own life, get help right away. Go to your nearest emergency department or: Call your local emergency services (911 in the [place] Call a suicide crisis helpline, such as the National Suicide Prevention Lifeline at 720 645 3027 or 988 in the [place] This is open 24 hours a day in the [place] If you're a Veteran: Call 988 and press 1. This is open 24 hours a day. Text the [employer] at 9100687475. Summary Generalized anxiety disorder (GAD) is a mental health condition that involves worry that is not triggered by a specific event. People with GAD often worry excessively about many things in their lives, such as their health and family. GAD may cause symptoms such as restlessness, trouble concentrating, sleep problems, frequent sweating, nausea, diarrhea, headaches, and trembling or muscle twitching. A mental health specialist can help determine which treatment is best for you. Some people see improvement with one type of therapy. However, other people require a combination of therapies. This information is not intended to replace advice given to you by your health care provider. Make sure you discuss any questions you have with your health care provider. Document Revised: 02/25/2023 Document Reviewed: 11/03/2020 Elsevier Patient Education  2024 [employer].

## 2024-01-03 NOTE — Progress Notes (Unsigned)
 Acute Office Visit  Subjective:     Patient ID: Cindy Dunlap, female    DOB: 07-13-1991, 33 y.o.   MRN: [id number]  No chief complaint on file.   HPI Patient is in today for episodes of shortness of breath that create anxiety and then worsening shortness of breath and then resolve. She has been having these over last few months. They do not occur with exertion. She exercises without concern or issues. She denies any swelling or extremities. She denies any CP. She at times can feel like her heart "flutters". She is overall pretty anxious but does not like to take medication. She has never taken clonazepam  given. She admits she has not been taking her b12 and vitamin D  like she should.  ROS See HPI.      Objective:    BP 122/72 (BP Location: Right Arm, Patient Position: Sitting, Cuff Size: Normal)   Pulse 82   Wt 177 lb (80.3 kg)   SpO2 100%   BMI 28.57 kg/m  BP Readings from Last 3 Encounters:  01/03/24 122/72  10/18/23 116/78  10/13/23 116/82   Wt Readings from Last 3 Encounters:  01/03/24 177 lb (80.3 kg)  08/16/23 177 lb (80.3 kg)  11/23/22 159 lb 0.6 oz (72.1 kg)      ..    08/17/2023    7:47 AM 11/23/2022    3:14 PM 08/11/2021    4:49 PM 10/07/2020    1:27 PM 10/07/2020    1:26 PM  Depression screen PHQ 2/9  Decreased Interest 0 0 0 0 0  Down, Depressed, Hopeless 0 0 0 0 0  PHQ - 2 Score 0 0 0 0 0  Altered sleeping   3 0   Tired, decreased energy   1 0   Change in appetite   1 0   Feeling bad or failure about yourself    0    Trouble concentrating   1 0   Moving slowly or fidgety/restless   1 0   Suicidal thoughts   0 0   PHQ-9 Score   7 0   Difficult doing work/chores   Somewhat difficult Not difficult at all    ..    08/17/2023    7:48 AM 08/11/2021    4:50 PM 02/26/2020    1:03 PM 10/26/2019    9:58 AM  GAD 7 : Generalized Anxiety Score  Nervous, Anxious, on Edge 1 1 0 1  Control/stop worrying 1 1 1  0  Worry too much - different things 1 1 0 1   Trouble relaxing 1 1 0 1  Restless 1 1 0 0  Easily annoyed or irritable 1 0 0 0  Afraid - awful might happen 1 1 0 0  Total GAD 7 Score 7 6 1 3   Anxiety Difficulty Somewhat difficult Somewhat difficult Not difficult at all Not difficult at all     Physical Exam Constitutional:      Appearance: Normal appearance.  HENT:     Head: Normocephalic.  Cardiovascular:     Rate and Rhythm: Normal rate and regular rhythm.  Pulmonary:     Effort: Pulmonary effort is normal.     Breath sounds: Normal breath sounds.  Musculoskeletal:     Cervical back: Normal range of motion and neck supple. No tenderness.     Right lower leg: No edema.     Left lower leg: No edema.  Lymphadenopathy:     Cervical: No cervical adenopathy.  Neurological:     General: No focal deficit present.     Mental Status: She is alert and oriented to person, place, and time.  Psychiatric:        Mood and Affect: Mood normal.          Assessment & Plan:  Aaron AasAaron AasDiagnoses and all orders for this visit:  SOB (shortness of breath) -     B12 and Folate Panel -     VITAMIN D  25 Hydroxy (Vit-D Deficiency, Fractures) -     Fe+TIBC+Fer  Vitamin D  deficiency -     VITAMIN D  25 Hydroxy (Vit-D Deficiency, Fractures)  B12 deficiency -     B12 and Folate Panel  Anxiety  Will recheck b12, vitamin d , hemoglobin and iron to look for any metabolic reasons for SOB and increased anxiety.  Hx of low b12 and vitamin D .  Suspect anxiety giving sensation of SOB.  Start ashwaganda 300mg  bid and use klonapin if needed to see if helpful Follow up in 3 months or sooner if needed or if symptoms worsen.     Kyzer Blowe, PA-C

## 2024-01-04 ENCOUNTER — Ambulatory Visit: Payer: Self-pay | Admitting: Physician Assistant

## 2024-01-04 ENCOUNTER — Encounter: Payer: Self-pay | Admitting: Physician Assistant

## 2024-01-04 LAB — IRON,TIBC AND FERRITIN PANEL
Ferritin: 19 ng/mL (ref 15–150)
Iron Saturation: 15 % (ref 15–55)
Iron: 49 ug/dL (ref 27–159)
Total Iron Binding Capacity: 322 ug/dL (ref 250–450)
UIBC: 273 ug/dL (ref 131–425)

## 2024-01-04 LAB — VITAMIN D 25 HYDROXY (VIT D DEFICIENCY, FRACTURES): Vit D, 25-Hydroxy: 25 ng/mL — ABNORMAL LOW (ref 30.0–100.0)

## 2024-01-04 LAB — B12 AND FOLATE PANEL
Folate: 5.9 ng/mL (ref >3.0)
Vitamin B-12: 185 pg/mL — ABNORMAL LOW (ref 232–1245)

## 2024-01-04 NOTE — Progress Notes (Signed)
 Fairy,   You have not been consistent with b12 and vitamin D , right?   You B12 has dropped more. You need to start liquid drops b12 1000mcg daily and vitamin D3/K2 at least 2000 units daily and 60mg  OTC iron supplement with breakfast every morning.   Recheck labs in 3 months.

## 2024-01-05 NOTE — Progress Notes (Signed)
 PURE brand liquid drops.   Not getting enough in your diet and/or not absorbing it well.

## 2024-02-04 ENCOUNTER — Ambulatory Visit
Admission: RE | Admit: 2024-02-04 | Discharge: 2024-02-04 | Disposition: A | Discharge status: Home / Self Care | Source: Ambulatory Visit | Attending: Family Medicine | Admitting: Family Medicine

## 2024-02-04 VITALS — BP 114/81 | HR 66 | Temp 98.1°F | Resp 19

## 2024-02-04 DIAGNOSIS — R059 Cough, unspecified: Secondary | ICD-10-CM | POA: Diagnosis not present

## 2024-02-04 DIAGNOSIS — J069 Acute upper respiratory infection, unspecified: Secondary | ICD-10-CM

## 2024-02-04 MED ORDER — AMOXICILLIN-POT CLAVULANATE 875-125 MG PO TABS: 1.0000 | ORAL_TABLET | Freq: Two times a day (BID) | ORAL | 0 refills | Status: DC

## 2024-02-04 MED ORDER — PREDNISONE 20 MG PO TABS: ORAL_TABLET | ORAL | 0 refills | Status: DC

## 2024-02-04 NOTE — ED Provider Notes (Signed)
 Cindy Dunlap    CSN: [id number] Arrival date & time: 02/04/24  9167      History   Chief Complaint Chief Complaint  Patient presents with   Cough    Entered by patient   Sore Throat    HPI Cindy Dunlap is a 33 y.o. female.   HPI Pleasant 33 year old female presents with sore throat and cough since 01/28/24.  Reports she has been using Tylenol, Mucinex and Zyrtec with no relief.  PMH significant for chronic neck pain, history of urinary tract infection, and anxiety.  She is accompanied by her 3 daughters who will also be evaluated today.  Past Medical History:  Diagnosis Date   Anxiety    GERD (gastroesophageal reflux disease)     Patient Active Problem List   Diagnosis Date Noted   SOB (shortness of breath) 01/03/2024   B12 deficiency 11/17/2023   Vitamin D  deficiency 11/17/2023   History of kidney stones 10/13/2023   Chronic neck pain 08/17/2023   Recent skin changes 08/17/2023   Solar lentigo 08/17/2023   Anxiety 08/17/2023   Palpitations 08/12/2022   Gallstones 06/15/2022   Epigastric pain determined by examination 07/29/2018   Nausea 07/29/2018   History of urinary tract infection 07/29/2018   Birth control 01/14/2016   History of anemia 01/14/2016   Family history of cardiac disorder 01/14/2016    Past Surgical History:  Procedure Laterality Date   DILATION AND CURETTAGE OF UTERUS  2015    OB History   No obstetric history on file.      Home Medications    Prior to Admission medications   Medication Sig Start Date End Date Taking? Authorizing Provider  amoxicillin -clavulanate (AUGMENTIN ) 875-125 MG tablet Take 1 tablet by mouth every 12 (twelve) hours. 02/04/24  Yes Teddy Sharper, FNP  predniSONE  (DELTASONE ) 20 MG tablet Take 3 tabs PO daily x 5 days. 02/04/24  Yes Teddy Sharper, FNP  clonazePAM  (KLONOPIN ) 0.25 MG disintegrating tablet Take 1 tablet (0.25 mg total) by mouth 2 (two) times daily. 08/16/23   Breeback, Jade L, PA-C   cyclobenzaprine  (FLEXERIL ) 5 MG tablet Take 1 tablet 30 minutes before bed.  May repeat x 1 if needed 10/18/23   Maranda Jamee Jacob, MD  fluticasone  (FLONASE ) 50 MCG/ACT nasal spray Place 2 sprays into both nostrils daily. 10/14/23   Gladis Elsie BROCKS, PA-C  ibuprofen  (ADVIL ) 800 MG tablet Take 800 mg by mouth every 8 (eight) hours as needed.    [provider]    Family History Family History  Problem Relation Age of Onset   Atrial fibrillation Mother    Hyperlipidemia Father    Hypertension Father    Heart attack Father    Lymphoma Maternal Grandmother    Dementia Maternal Grandfather    Diabetes Paternal Grandmother    Stroke Paternal Grandmother    Heart attack Paternal Grandmother    Cancer Paternal Grandfather        LIVER   Heart attack Paternal Grandfather    Colon cancer Maternal Uncle    Stomach cancer Paternal Uncle    Esophageal cancer Paternal Uncle    Pancreatic cancer Neg Hx    Rectal cancer Neg Hx     Social History Social History   Tobacco Use   Smoking status: Never   Smokeless tobacco: Never  Vaping Use   Vaping status: Never Used  Substance Use Topics   Alcohol use: Not Currently   Drug use: Never     Allergies  Ciprofloxacin   Review of Systems Review of Systems  Respiratory:  Positive for cough.   All other systems reviewed and are negative.    Physical Exam Triage Vital Signs ED Triage Vitals  Encounter Vitals Group     BP      Girls Systolic BP Percentile      Girls Diastolic BP Percentile      Boys Systolic BP Percentile      Boys Diastolic BP Percentile      Pulse      Resp      Temp      Temp src      SpO2      Weight      Height      Head Circumference      Peak Flow      Pain Score      Pain Loc      Pain Education      Exclude from Growth Chart    No data found.  Updated Vital Signs BP 114/81   Pulse 66   Temp 98.1 F (36.7 C)   Resp 19   LMP 01/22/2024   SpO2 98%    Physical Exam Vitals  and nursing note reviewed.  Constitutional:      Appearance: Normal appearance. She is obese. She is ill-appearing.  HENT:     Head: Normocephalic and atraumatic.     Right Ear: Tympanic membrane, ear canal and external ear normal.     Left Ear: Tympanic membrane, ear canal and external ear normal.     Mouth/Throat:     Mouth: Mucous membranes are moist.     Pharynx: Oropharynx is clear.  Eyes:     Extraocular Movements: Extraocular movements intact.     Conjunctiva/sclera: Conjunctivae normal.     Pupils: Pupils are equal, round, and reactive to light.  Cardiovascular:     Rate and Rhythm: Normal rate and regular rhythm.     Pulses: Normal pulses.     Heart sounds: Normal heart sounds.  Pulmonary:     Effort: Pulmonary effort is normal.     Breath sounds: Normal breath sounds. No wheezing, rhonchi or rales.     Comments: Frequent nonproductive cough noted on exam Musculoskeletal:        General: Normal range of motion.     Cervical back: Normal range of motion and neck supple.  Skin:    General: Skin is warm and dry.  Neurological:     General: No focal deficit present.     Mental Status: She is alert and oriented to person, place, and time. Mental status is at baseline.  Psychiatric:        Mood and Affect: Mood normal.        Behavior: Behavior normal.      UC Treatments / Results  Labs (all labs ordered are listed, but only abnormal results are displayed) Labs Reviewed - No data to display  EKG   Radiology No results found.  Procedures Procedures (including critical Dunlap time)  Medications Ordered in UC Medications - No data to display  Initial Impression / Assessment and Plan / UC Course  I have reviewed the triage vital signs and the nursing notes.  Pertinent labs & imaging results that were available during my Dunlap of the patient were reviewed by me and considered in my medical decision making (see chart for details).     MDM: 1.  Acute URI-Rx'd  Augmentin  875/125 mg tablet:  Take 1 tablet twice daily x 7 days; 2.  Cough, unspecified type-Rx'd prednisone  20 mg tablet: Take 3 tablets p.o. daily x 5 days. Advised patient take medications as directed with food to completion.  Advised patient to take prednisone  with first dose of Augmentin  for the next 5 of 7 days.  Encouraged increase daily water intake to 64 ounces per day while taking these medications.  Advised if symptoms worsen and/or unresolved please follow-up with your PCP or here for further evaluation.  Patient discharged home, hemodynamically stable. Final Clinical Impressions(s) / UC Diagnoses   Final diagnoses:  Cough, unspecified type  Acute URI     Discharge Instructions      Advised patient take medications as directed with food to completion.  Advised patient to take prednisone  with first dose of Augmentin  for the next 5 of 7 days.  Encouraged increase daily water intake to 64 ounces per day while taking these medications.  Advised if symptoms worsen and/or unresolved please follow-up with your PCP or here for further evaluation.     ED Prescriptions     Medication Sig Dispense Auth. Provider   amoxicillin -clavulanate (AUGMENTIN ) 875-125 MG tablet Take 1 tablet by mouth every 12 (twelve) hours. 14 tablet Leonard Hendler, FNP   predniSONE  (DELTASONE ) 20 MG tablet Take 3 tabs PO daily x 5 days. 15 tablet Enrico Eaddy, FNP      PDMP not reviewed this encounter.   Teddy Sharper, FNP 02/04/24 [phone]

## 2024-02-04 NOTE — Discharge Instructions (Addendum)
 Advised patient take medications as directed with food to completion.  Advised patient to take prednisone with first dose of Augmentin for the next 5 of 7 days.  Encouraged increase daily water intake to 64 ounces per day while taking these medications.  Advised if symptoms worsen and/or unresolved please follow-up with your PCP or here for further evaluation.

## 2024-02-04 NOTE — ED Triage Notes (Signed)
 Pt presents to uc with co sore throat and cough since last Friday. Pt reports she has been using tylenol and musinex, tylenol and zyrtec.

## 2024-02-14 ENCOUNTER — Ambulatory Visit: Payer: BC Managed Care – PPO | Admitting: Physician Assistant

## 2024-02-22 ENCOUNTER — Encounter: Payer: Self-pay | Admitting: Family Medicine

## 2024-02-22 ENCOUNTER — Ambulatory Visit (INDEPENDENT_AMBULATORY_CARE_PROVIDER_SITE_OTHER): Admitting: Family Medicine

## 2024-02-22 ENCOUNTER — Ambulatory Visit: Payer: Self-pay

## 2024-02-22 VITALS — BP 125/83 | HR 89 | Temp 98.7°F | Ht 66.0 in | Wt 168.0 lb

## 2024-02-22 DIAGNOSIS — R1011 Right upper quadrant pain: Secondary | ICD-10-CM | POA: Diagnosis not present

## 2024-02-22 NOTE — Patient Instructions (Signed)
 Recommend start Prilosec or Nexium daily for the next 2 weeks take about 20 minutes before your first meal the day or at bedtime.  Avoid anything with oils, fats or greasy foods.

## 2024-02-22 NOTE — Progress Notes (Signed)
 Acute Office Visit  Subjective:     Patient ID: Cindy Dunlap, female    DOB: 1991/05/18, 33 y.o.   MRN: [id number]  Chief Complaint  Patient presents with   Abdominal Pain    Onset 1 day pt states she was supposed to have gallbladder removed but never went pt states it could have been something she ate that set off the gallbladder attacks which feels like spasms    HPI Patient is in today for right-sided abdominal pain that started about a day ago.  She has a prior history of gallbladder issues and had a consultation in the past and they had actually recommended gallbladder removal.  She says the pain is more like sharp spasms is not as intense as it was. She has been nauseated with it.  Had a consultation in November 2023 with general surgery at [facility] after having an ultrasound in March 2023 showing gallbladder polyps with the largest measuring about 7 mm.  They did recommend yearly follow-up with ultrasound to ensure no interval growth.  At the time she had opted not to have surgery..  ROS      Objective:    BP 125/83   Pulse 89   Temp 98.7 F (37.1 C) (Oral)   Ht 5' 6 (1.676 m)   Wt 168 lb (76.2 kg)   LMP 01/22/2024   SpO2 98%   BMI 27.12 kg/m    Physical Exam Vitals and nursing note reviewed.  Constitutional:      Appearance: Normal appearance.  HENT:     Head: Normocephalic and atraumatic.  Eyes:     Conjunctiva/sclera: Conjunctivae normal.  Cardiovascular:     Rate and Rhythm: Normal rate and regular rhythm.  Pulmonary:     Effort: Pulmonary effort is normal.     Breath sounds: Normal breath sounds.  Abdominal:     Tenderness: There is abdominal tenderness in the right upper quadrant and epigastric area. There is no guarding.  Skin:    General: Skin is warm and dry.  Neurological:     Mental Status: She is alert.  Psychiatric:        Mood and Affect: Mood normal.     No results found for any visits on 02/22/24.      Assessment & Plan:    Problem List Items Addressed This Visit   None Visit Diagnoses       Right upper quadrant pain    -  Primary   Relevant Orders   CMP14+EGFR   CBC with Differential/Platelet   US  Abdomen Complete       Right upper quadrant pain-certainly could be gallbladder related.  Interestingly she had a history of polyps less than a centimeter 2 years ago but had a normal HIDA scan.  She was scheduled to have her gallbladder removed but had to cancel multiple times because of illnesses and then she just got better on her own so she never returned.  We did discuss avoiding greasy oily fatty foods for now really working on a bland diet starting a PPI as she has had a little bit more heartburn than usual over the last week as well after just finishing a round of antibiotics so certainly she could have some component of gastritis that could be consistent contributing to her symptoms.  I would like to schedule her for an updated ultrasound to reevaluate those polyps and to see if there are any changes.  And at that point she can  decide if she is doing well if she wants to continue to monitor or if she wants further GI workup and/or referral to general surgery.   No orders of the defined types were placed in this encounter.   No follow-ups on file.  Dorothyann Byars, MD

## 2024-02-22 NOTE — Telephone Encounter (Signed)
 FYI Only or Action Required?: FYI only for provider.  Patient was last seen in primary care on 01/03/2024 by Antoniette Vermell CROME, PA-C.  Called Nurse Triage reporting Abdominal Pain.  Symptoms began yesterday.  Interventions attempted: Other: heating pad to area.  Symptoms are: unchanged.  Triage Disposition: See Physician Within 24 Hours  Patient/caregiver understands and will follow disposition?: Yes     Copied from CRM #[id number]. Topic: Clinical - Red Word Triage >> Feb 22, 2024 11:15 AM Carrielelia G wrote: [facility] that prompted transfer to Nurse Triage: abdominal or gallbladder pain Reason for Disposition  [1] MODERATE pain (e.g., interferes with normal activities) AND [2] pain comes and goes (cramps) AND [3] present > 24 hours  (Exception: Pain with Vomiting or Diarrhea - see that Guideline.)  Answer Assessment - Initial Assessment Questions 1. LOCATION: Where does it hurt?      Gallbladder area: right side of Abd 2. RADIATION: Does the pain shoot anywhere else? (e.g., chest, back)     no 3. ONSET: When did the pain begin? (e.g., minutes, hours or days ago)      yesterday 4. SUDDEN: Gradual or sudden onset?     gradual 5. PATTERN Does the pain come and go, or is it constant?     Comes and goes 6. SEVERITY: How bad is the pain?  (e.g., Scale 1-10; mild, moderate, or severe)     6/10 7. RECURRENT SYMPTOM: Have you ever had this type of stomach pain before? If Yes, ask: When was the last time? and What happened that time?      yes 8. CAUSE: What do you think is causing the stomach pain? (e.g., gallstones, recent abdominal surgery)     Possible gallbladder 9. RELIEVING/AGGRAVATING FACTORS: What makes it better or worse? (e.g., antacids, bending or twisting motion, bowel movement)     Bending makes the patient feel better 10. OTHER SYMPTOMS: Do you have any other symptoms? (e.g., back pain, diarrhea, fever, urination pain, vomiting)       Nausea, abd  spasms 11. PREGNANCY: Is there any chance you are pregnant? When was your last menstrual period?       na  Protocols used: Abdominal Pain - Female-A-AH

## 2024-02-23 ENCOUNTER — Ambulatory Visit

## 2024-02-23 ENCOUNTER — Ambulatory Visit: Payer: Self-pay | Admitting: Family Medicine

## 2024-02-23 DIAGNOSIS — R1011 Right upper quadrant pain: Secondary | ICD-10-CM | POA: Diagnosis not present

## 2024-02-23 DIAGNOSIS — R161 Splenomegaly, not elsewhere classified: Secondary | ICD-10-CM | POA: Diagnosis not present

## 2024-02-23 DIAGNOSIS — K824 Cholesterolosis of gallbladder: Secondary | ICD-10-CM | POA: Diagnosis not present

## 2024-02-23 LAB — CBC WITH DIFFERENTIAL/PLATELET
Basophils Absolute: 0 x10E3/uL (ref 0.0–0.2)
Basos: 1 %
EOS (ABSOLUTE): 0 x10E3/uL (ref 0.0–0.4)
Eos: 1 %
Hematocrit: 38.1 % (ref 34.0–46.6)
Hemoglobin: 12.4 g/dL (ref 11.1–15.9)
Immature Grans (Abs): 0 x10E3/uL (ref 0.0–0.1)
Immature Granulocytes: 0 %
Lymphocytes Absolute: 1.1 x10E3/uL (ref 0.7–3.1)
Lymphs: 29 %
MCH: 30.3 pg (ref 26.6–33.0)
MCHC: 32.5 g/dL (ref 31.5–35.7)
MCV: 93 fL (ref 79–97)
Monocytes Absolute: 0.5 x10E3/uL (ref 0.1–0.9)
Monocytes: 12 %
Neutrophils Absolute: 2.2 x10E3/uL (ref 1.4–7.0)
Neutrophils: 57 %
Platelets: 216 x10E3/uL (ref 150–450)
RBC: 4.09 x10E6/uL (ref 3.77–5.28)
RDW: 12.2 % (ref 11.7–15.4)
WBC: 3.8 x10E3/uL (ref 3.4–10.8)

## 2024-02-23 LAB — CMP14+EGFR
ALT: 9 IU/L (ref 0–32)
AST: 12 IU/L (ref 0–40)
Albumin: 4.3 g/dL (ref 3.9–4.9)
Alkaline Phosphatase: 76 IU/L (ref 44–121)
BUN/Creatinine Ratio: 15 (ref 9–23)
BUN: 12 mg/dL (ref 6–20)
Bilirubin Total: 0.4 mg/dL (ref 0.0–1.2)
CO2: 19 mmol/L — ABNORMAL LOW (ref 20–29)
Calcium: 9.2 mg/dL (ref 8.7–10.2)
Chloride: 106 mmol/L (ref 96–106)
Creatinine, Ser: 0.8 mg/dL (ref 0.57–1.00)
Globulin, Total: 2.3 g/dL (ref 1.5–4.5)
Glucose: 93 mg/dL (ref 70–99)
Potassium: 4 mmol/L (ref 3.5–5.2)
Sodium: 142 mmol/L (ref 134–144)
Total Protein: 6.6 g/dL (ref 6.0–8.5)
eGFR: 100 mL/min/1.73 (ref >59)

## 2024-02-23 NOTE — Progress Notes (Signed)
 Your lab work is within acceptable range and there are no concerning findings.   ?

## 2024-03-01 NOTE — Progress Notes (Signed)
 Hi Cindy Dunlap, just wanted to let you know that the ultrasound actually looks reassuring they did see the 2 gallbladder polyps measuring about 7 mm.  They said that they were similar in parents to the ultrasound that was done back in 2023 which is great so they really have not changed.  There is still pretty small less than a centimeter.  They did note that the spleen was mildly enlarged but this is not in the area causing your pain I think is just more of a coincidental finding.  Are you still having some of the symptoms?  Or are you feeling better?  If you are still not feeling well then I think we should refer you to GI for further workup.

## 2024-03-06 ENCOUNTER — Encounter: Payer: Self-pay | Admitting: Physician Assistant

## 2024-03-06 ENCOUNTER — Ambulatory Visit (INDEPENDENT_AMBULATORY_CARE_PROVIDER_SITE_OTHER): Admitting: Physician Assistant

## 2024-03-06 VITALS — BP 126/71 | HR 73 | Ht 66.0 in | Wt 173.0 lb

## 2024-03-06 DIAGNOSIS — M2550 Pain in unspecified joint: Secondary | ICD-10-CM | POA: Diagnosis not present

## 2024-03-06 DIAGNOSIS — E538 Deficiency of other specified B group vitamins: Secondary | ICD-10-CM

## 2024-03-06 DIAGNOSIS — R058 Other specified cough: Secondary | ICD-10-CM | POA: Insufficient documentation

## 2024-03-06 DIAGNOSIS — R1011 Right upper quadrant pain: Secondary | ICD-10-CM

## 2024-03-06 DIAGNOSIS — E559 Vitamin D deficiency, unspecified: Secondary | ICD-10-CM | POA: Diagnosis not present

## 2024-03-06 MED ORDER — BENZONATATE 200 MG PO CAPS
200.0000 mg | ORAL_CAPSULE | Freq: Three times a day (TID) | ORAL | 0 refills | Status: DC | PRN
Start: 2024-03-06 — End: 2024-04-24

## 2024-03-06 NOTE — Patient Instructions (Signed)
 Tessalon  pearls for cough as needed Ibuprofen  800mg  up to three times a day as needed

## 2024-03-06 NOTE — Progress Notes (Signed)
   Established Patient Office Visit  Subjective   Patient ID: Cindy Dunlap, female    DOB: [birth year]  Age: 33 y.o. MRN: [id number]   HPI Pt is a 33 yo female who presents to the clinic to follow up on low b12 and vitamin D  levels. She admits she has not been consistent with taking her vitamins like she should.   She and her whole family have been off and on sick for the last 2 and 1/2 months. They were never tested for covid but suspect that was the first sickness but has seemed to progress from there with upper and lower respiratory symptoms. She was treated last at [facility] with zpak and prednisone . She does not have an active fever but she is still tired, coughing, and has terrible body aches. Her hands ache terribly in the morning and she does not feel good. She is not having any trouble breathing.    ROS See HPI.    Objective:     BP 126/71   Pulse 73   Ht 5' 6 (1.676 m)   Wt 173 lb (78.5 kg)   SpO2 99%   BMI 27.92 kg/m  BP Readings from Last 3 Encounters:  03/06/24 126/71  02/22/24 125/83  02/04/24 114/81   Wt Readings from Last 3 Encounters:  03/06/24 173 lb (78.5 kg)  02/22/24 168 lb (76.2 kg)  01/03/24 177 lb (80.3 kg)      Physical Exam Constitutional:      Appearance: Normal appearance.  HENT:     Head: Normocephalic.  Cardiovascular:     Rate and Rhythm: Normal rate and regular rhythm.     Heart sounds: Normal heart sounds.  Pulmonary:     Effort: Pulmonary effort is normal.     Breath sounds: Normal breath sounds.  Musculoskeletal:     Right lower leg: No edema.     Left lower leg: No edema.     Comments: No swelling of hands or lower extremity.   Skin:    Findings: No rash.  Neurological:     General: No focal deficit present.     Mental Status: She is alert and oriented to person, place, and time.  Psychiatric:        Mood and Affect: Mood normal.       Assessment & Plan:  Cindy Dunlap was seen today for medical management of chronic  issues.  Diagnoses and all orders for this visit:  Post-viral cough syndrome -     benzonatate  (TESSALON ) 200 MG capsule; Take 1 capsule (200 mg total) by mouth 3 (three) times daily as needed.  B12 deficiency -     B12 and Folate Panel  Vitamin D  deficiency -     VITAMIN D  25 Hydroxy (Vit-D Deficiency, Fractures)  Polyarthralgia   Certainly appears like she has some post viral symptoms Vitals and physical exam reassuring Discussed timeline of viruses and post viral symptoms Use ibuprofen  800mg  as needed for aching Add magnesium glycinate at bedtime to help with aching Tessalon  pearls given to use for as needed coughing spells If aching not improving in next 2 weeks then need to do more lab work to look for post viral complications  Will recheck b12/vitamin D  in labs today    Vermell Bologna, PA-C

## 2024-03-07 ENCOUNTER — Encounter: Payer: Self-pay | Admitting: Physician Assistant

## 2024-03-07 ENCOUNTER — Ambulatory Visit: Payer: Self-pay | Admitting: Physician Assistant

## 2024-03-07 LAB — B12 AND FOLATE PANEL
Folate: 6.4 ng/mL (ref >3.0)
Vitamin B-12: 244 pg/mL (ref 232–1245)

## 2024-03-07 LAB — VITAMIN D 25 HYDROXY (VIT D DEFICIENCY, FRACTURES): Vit D, 25-Hydroxy: 22.3 ng/mL — ABNORMAL LOW (ref 30.0–100.0)

## 2024-03-07 MED ORDER — VITAMIN D (ERGOCALCIFEROL) 1.25 MG (50000 UNIT) PO CAPS: 50000.0000 [IU] | ORAL_CAPSULE | ORAL | 0 refills | Status: AC

## 2024-03-07 NOTE — Progress Notes (Signed)
 Cindy Dunlap,   Vitamin D  worse. I am going to give you the weekly 50,000 units capsules to take for 3 months and see where your D levels are then.   B12 better but not to goal. Do you know how much you are taking?

## 2024-04-24 ENCOUNTER — Ambulatory Visit
Admission: RE | Admit: 2024-04-24 | Discharge: 2024-04-24 | Disposition: A | Discharge status: Home / Self Care | Attending: Emergency Medicine | Admitting: Emergency Medicine

## 2024-04-24 VITALS — BP 117/78 | HR 86 | Temp 98.8°F | Resp 17

## 2024-04-24 DIAGNOSIS — H65192 Other acute nonsuppurative otitis media, left ear: Secondary | ICD-10-CM

## 2024-04-24 MED ORDER — IBUPROFEN 800 MG PO TABS
800.0000 mg | ORAL_TABLET | Freq: Once | ORAL | Status: AC
  Administered 2024-04-24: 800 mg via ORAL

## 2024-04-24 MED ORDER — IBUPROFEN 800 MG PO TABS: 800.0000 mg | ORAL_TABLET | Freq: Three times a day (TID) | ORAL | 0 refills | Status: AC

## 2024-04-24 MED ORDER — AMOXICILLIN-POT CLAVULANATE 875-125 MG PO TABS: 1.0000 | ORAL_TABLET | Freq: Two times a day (BID) | ORAL | 0 refills | Status: AC

## 2024-04-24 NOTE — Discharge Instructions (Addendum)
 Augmentin  antibiotic twice daily for 7 days Take with food to avoid upset stomach. It can take about 3 days to really start working  Ibuprofen  1 tablet every 6 hours as needed for pain  I also recommend using a nasal saline rinse, or nasal spray such as flonase 

## 2024-04-24 NOTE — ED Triage Notes (Signed)
 Pt c/o bilateral ear pain and pressure, sore throat, HA and dizziness since waking up this am. Taking advil  prn.

## 2024-04-24 NOTE — ED Provider Notes (Signed)
 Cindy Dunlap [facility]    CSN: [id number] Arrival date & time: 04/24/24  1853      History   Chief Complaint Chief Complaint  Patient presents with   Otalgia    bilateral   Sore Throat   Headache    HPI Cindy Dunlap is a 33 y.o. female.    Past Medical History:  Diagnosis Date   Anxiety    GERD (gastroesophageal reflux disease)     Patient Active Problem List   Diagnosis Date Noted   Post-viral cough syndrome 03/06/2024   Polyarthralgia 03/06/2024   SOB (shortness of breath) 01/03/2024   B12 deficiency 11/17/2023   Vitamin D  deficiency 11/17/2023   History of kidney stones 10/13/2023   Chronic neck pain 08/17/2023   Recent skin changes 08/17/2023   Solar lentigo 08/17/2023   Anxiety 08/17/2023   Palpitations 08/12/2022   Gallstones 06/15/2022   Epigastric pain determined by examination 07/29/2018   Nausea 07/29/2018   History of urinary tract infection 07/29/2018   Birth control 01/14/2016   History of anemia 01/14/2016   Family history of cardiac disorder 01/14/2016    Past Surgical History:  Procedure Laterality Date   DILATION AND CURETTAGE OF UTERUS  2015    OB History   No obstetric history on file.      Home Medications    Prior to Admission medications   Medication Sig Start Date End Date Taking? Authorizing Provider  amoxicillin -clavulanate (AUGMENTIN ) 875-125 MG tablet Take 1 tablet by mouth every 12 (twelve) hours for 7 days. 04/24/24 05/01/24 Yes Marnae Madani, [place], PA-C  ibuprofen  (ADVIL ) 800 MG tablet Take 1 tablet (800 mg total) by mouth 3 (three) times daily. 04/24/24  Yes Arlethia Basso, [place], PA-C  clonazePAM  (KLONOPIN ) 0.25 MG disintegrating tablet Take 1 tablet (0.25 mg total) by mouth 2 (two) times daily. 08/16/23   Breeback, Jade L, PA-C  fluticasone  (FLONASE ) 50 MCG/ACT nasal spray Place 2 sprays into both nostrils daily. 10/14/23   Gladis Elsie BROCKS, PA-C  Vitamin D , Ergocalciferol , (DRISDOL ) 1.25 MG (50000 UNIT) CAPS capsule Take 1  capsule (50,000 Units total) by mouth every 7 (seven) days. 03/07/24   Antoniette Vermell CROME, PA-C    Family History Family History  Problem Relation Age of Onset   Atrial fibrillation Mother    Hyperlipidemia Father    Hypertension Father    Heart attack Father    Lymphoma Maternal Grandmother    Dementia Maternal Grandfather    Diabetes Paternal Grandmother    Stroke Paternal Grandmother    Heart attack Paternal Grandmother    Cancer Paternal Grandfather        LIVER   Heart attack Paternal Grandfather    Colon cancer Maternal Uncle    Stomach cancer Paternal Uncle    Esophageal cancer Paternal Uncle    Pancreatic cancer Neg Hx    Rectal cancer Neg Hx     Social History Social History   Tobacco Use   Smoking status: Never   Smokeless tobacco: Never  Vaping Use   Vaping status: Never Used  Substance Use Topics   Alcohol use: Not Currently   Drug use: Never     Allergies   Ciprofloxacin   Review of Systems Review of Systems  HENT:  Positive for ear pain.   Neurological:  Positive for headaches.     Physical Exam Triage Vital Signs ED Triage Vitals  Encounter Vitals Group     BP 04/24/24 1927 117/78     Girls Systolic BP  Percentile --      Girls Diastolic BP Percentile --      Boys Systolic BP Percentile --      Boys Diastolic BP Percentile --      Pulse Rate 04/24/24 1927 86     Resp 04/24/24 1927 17     Temp 04/24/24 1927 98.8 F (37.1 C)     Temp Source 04/24/24 1927 Oral     SpO2 04/24/24 1927 97 %     Weight --      Height --      Head Circumference --      Peak Flow --      Pain Score 04/24/24 1928 6     Pain Loc --      Pain Education --      Exclude from Growth Chart --    No data found.  Updated Vital Signs BP 117/78 (BP Location: Right Arm)   Pulse 86   Temp 98.8 F (37.1 C) (Oral)   Resp 17   LMP 04/22/2024 (Exact Date)   SpO2 97%   Visual Acuity Right Eye Distance:   Left Eye Distance:   Bilateral Distance:    Right Eye  Near:   Left Eye Near:    Bilateral Near:     Physical Exam HENT:     Left Ear: Tenderness (tragal) present. No mastoid tenderness. Tympanic membrane is erythematous and bulging.      UC Treatments / Results  Labs (all labs ordered are listed, but only abnormal results are displayed) Labs Reviewed - No data to display  EKG   Radiology No results found.  Procedures Procedures (including critical care time)  Medications Ordered in UC Medications  ibuprofen  (ADVIL ) tablet 800 mg (has no administration in time range)    Initial Impression / Assessment and Plan / UC Course  I have reviewed the triage vital signs and the nursing notes.  Pertinent labs & imaging results that were available during my care of the patient were reviewed by me and considered in my medical decision making (see chart for details).     *** Final Clinical Impressions(s) / UC Diagnoses   Final diagnoses:  Other non-recurrent acute nonsuppurative otitis media of left ear     Discharge Instructions      Augmentin  antibiotic twice daily for 7 days Take with food to avoid upset stomach. It can take about 3 days to really start working  Ibuprofen  1 tablet every 6 hours as needed for pain  I also recommend using a nasal saline rinse, or nasal spray such as flonase     ED Prescriptions     Medication Sig Dispense Auth. Provider   amoxicillin -clavulanate (AUGMENTIN ) 875-125 MG tablet Take 1 tablet by mouth every 12 (twelve) hours for 7 days. 14 tablet Sabriya Yono, PA-C   ibuprofen  (ADVIL ) 800 MG tablet Take 1 tablet (800 mg total) by mouth 3 (three) times daily. 21 tablet Winfield Caba, [place], PA-C      PDMP not reviewed this encounter.

## 2024-06-23 DIAGNOSIS — S86911A Strain of unspecified muscle(s) and tendon(s) at lower leg level, right leg, initial encounter: Secondary | ICD-10-CM | POA: Diagnosis not present

## 2024-06-23 DIAGNOSIS — X501XXA Overexertion from prolonged static or awkward postures, initial encounter: Secondary | ICD-10-CM | POA: Diagnosis not present

## 2024-06-23 DIAGNOSIS — M79661 Pain in right lower leg: Secondary | ICD-10-CM | POA: Diagnosis not present

## 2024-06-23 DIAGNOSIS — F419 Anxiety disorder, unspecified: Secondary | ICD-10-CM | POA: Diagnosis not present

## 2024-06-23 DIAGNOSIS — M79604 Pain in right leg: Secondary | ICD-10-CM | POA: Diagnosis not present

## 2024-06-26 ENCOUNTER — Ambulatory Visit: Admitting: Physician Assistant

## 2024-06-26 ENCOUNTER — Encounter: Payer: Self-pay | Admitting: Physician Assistant

## 2024-06-26 VITALS — BP 121/82 | HR 76 | Ht 66.0 in | Wt 173.0 lb

## 2024-06-26 DIAGNOSIS — S86811D Strain of other muscle(s) and tendon(s) at lower leg level, right leg, subsequent encounter: Secondary | ICD-10-CM | POA: Diagnosis not present

## 2024-06-26 DIAGNOSIS — S86819A Strain of other muscle(s) and tendon(s) at lower leg level, unspecified leg, initial encounter: Secondary | ICD-10-CM | POA: Insufficient documentation

## 2024-06-26 MED ORDER — DICLOFENAC SODIUM 1 % EX GEL: 4.0000 g | Freq: Four times a day (QID) | CUTANEOUS | 1 refills | Status: AC

## 2024-06-26 NOTE — Patient Instructions (Signed)
 Voltaren gel as needed up to 4 times a day.  Start exercises, if no improvement in next week consider MRI of calf.

## 2024-06-26 NOTE — Progress Notes (Unsigned)
 Established Patient Office Visit  Subjective   Patient ID: Cindy Dunlap, female    DOB: 1990-11-18  Age: 33 y.o. MRN: [id number]  Chief Complaint  Patient presents with   Hospitalization Follow-up     Calf strain    HPI Discussed the use of AI scribe software for clinical note transcription with the patient, who gave verbal consent to proceed.  History of Present Illness Cindy Dunlap is a 33 year old female who presents with right medial calf pain due to strain and ED visit 06/23/2024.   Right lower extremity pain and functional impairment - Right leg pain for one week, onset after hearing a 'pop' while putting up Christmas decorations - Pain localized to the medial aspect of the right leg - Associated with muscle spasms extending from the calf to the toes - Unable to bear weight on the affected leg - Pain worsened by weight-bearing activities, especially when pushing off with the foot - Pain described as 'nauseating' - Standing feels unsteady with associated leg weakness - Sensation described as 'weird' rather than painful when attempting to stand on tiptoes - No bruising in the affected area  Gait abnormality and foot position - Foot feels uncomfortable due to being flat-footed and turning inwards - Standing feels unsteady  Muscle cramps and possible dehydration - Experiencing muscle cramps - Possible dehydration acknowledged as a contributing factor  Interventions and activity modification - Venous Doppler ultrasound of the lower extremity negative for deep vein thrombosis in the emergency department - Placed in a walking boot for one week after emergency department visit - Using walking boot primarily for mobility, removes it when resting - Foot kept elevated over the weekend - Activities limited to essential tasks    ROS See HPI.    Objective:     BP 121/82   Pulse 76   Ht 5' 6 (1.676 m)   Wt 173 lb (78.5 kg)   SpO2 99%   BMI 27.92 kg/m  BP  Readings from Last 3 Encounters:  06/26/24 121/82  04/24/24 117/78  03/06/24 126/71   Wt Readings from Last 3 Encounters:  06/26/24 173 lb (78.5 kg)  03/06/24 173 lb (78.5 kg)  02/22/24 168 lb (76.2 kg)      Physical Exam Constitutional:      Appearance: Normal appearance.  HENT:     Head: Normocephalic.  Cardiovascular:     Rate and Rhythm: Normal rate and regular rhythm.  Pulmonary:     Effort: Pulmonary effort is normal.  Musculoskeletal:     Right lower leg: No edema.     Left lower leg: No edema.     Comments: Right medial calf tenderness to palpation without swelling, warmth, redness. Patient was able to perform plantar flexion and dorsi flexion of foot. Achilles tendon intact. 2+ pedal pulses. Weak and unstable when trying to stand on her toes on the right.   Neurological:     General: No focal deficit present.     Mental Status: She is alert and oriented to person, place, and time.  Psychiatric:        Mood and Affect: Mood normal.        Assessment & Plan:  .Cindy Dunlap was seen today for hospitalization follow-up.  Diagnoses and all orders for this visit:  Strain of calf muscle, right, subsequent encounter -     diclofenac Sodium (VOLTAREN) 1 % GEL; Apply 4 g topically 4 (four) times daily. To affected joint.   Assessment & Plan  Right calf muscle strain Suspected partial muscle tear or strain with localized pain and muscle spasms. Negative for DVT and complete tendon tears. Healing expected in 1-3 weeks, possibly longer. - Continue walking boot for 1-3 weeks, especially if painful or weight-bearing. - Remove boot at night, perform calf stretches as tolerated. - Apply ice or heating pad for swelling and relaxation. - Consider magnesium supplementation for cramps. - Voltaren gel to use up to 4x a day as needed for pain and inflammation.  - Ensure adequate hydration to prevent cramps. - Consider MRI if no improvement in strength or persistent pain.    Dilon Lank, PA-C

## 2024-06-27 ENCOUNTER — Encounter: Payer: Self-pay | Admitting: Physician Assistant

## 2024-07-07 ENCOUNTER — Other Ambulatory Visit: Payer: Self-pay

## 2024-07-07 ENCOUNTER — Ambulatory Visit: Payer: Self-pay

## 2024-07-07 ENCOUNTER — Ambulatory Visit
Admission: RE | Admit: 2024-07-07 | Discharge: 2024-07-07 | Disposition: A | Discharge status: Home / Self Care | Attending: Physician Assistant

## 2024-07-07 VITALS — BP 118/78 | HR 103 | Temp 98.5°F | Resp 16 | Ht 66.0 in | Wt 165.0 lb

## 2024-07-07 DIAGNOSIS — N390 Urinary tract infection, site not specified: Secondary | ICD-10-CM | POA: Diagnosis not present

## 2024-07-07 DIAGNOSIS — R300 Dysuria: Secondary | ICD-10-CM

## 2024-07-07 DIAGNOSIS — R319 Hematuria, unspecified: Secondary | ICD-10-CM

## 2024-07-07 HISTORY — DX: Calculus of kidney: N20.0

## 2024-07-07 HISTORY — DX: Calculus of gallbladder without cholecystitis without obstruction: K80.20

## 2024-07-07 HISTORY — DX: Concussion with loss of consciousness status unknown, initial encounter: S06.0XAA

## 2024-07-07 HISTORY — DX: Headache, unspecified: R51.9

## 2024-07-07 LAB — POCT URINALYSIS DIP (MANUAL ENTRY)
Bilirubin, UA: NEGATIVE
Glucose, UA: NEGATIVE mg/dL
Nitrite, UA: NEGATIVE
Protein Ur, POC: NEGATIVE mg/dL
Spec Grav, UA: 1.01 (ref 1.010–1.025)
Urobilinogen, UA: 0.2 U/dL
pH, UA: 6 (ref 5.0–8.0)

## 2024-07-07 LAB — POCT URINE PREGNANCY: Preg Test, Ur: NEGATIVE

## 2024-07-07 MED ORDER — NITROFURANTOIN MONOHYD MACRO 100 MG PO CAPS: 100.0000 mg | ORAL_CAPSULE | Freq: Two times a day (BID) | ORAL | 0 refills | Status: AC

## 2024-07-07 NOTE — ED Provider Notes (Signed)
 [name]    CSN: [id number] Arrival date & time: 07/07/24  1759      History   Chief Complaint Chief Complaint  Patient presents with   Urinary Frequency    Entered by patient    HPI Cindy Dunlap is a 33 y.o. female.  has a past medical history of Anxiety, Calculus of gallbladder, Concussion, GERD (gastroesophageal reflux disease), Headache, and Kidney stone.   HPI Discussed the use of AI scribe software for clinical note transcription with the patient, who gave verbal consent to proceed.  The patient presents with symptoms suggestive of a urinary tract infection (UTI).  The patient experiences discomfort in the bladder, particularly after physical activities like vacuuming, described as cramping that intensifies when the bladder is full. Urination is not painful, and there is no burning sensation during urination.  Symptoms began on Wednesday night with severe pain initially thought to be due to an ovarian cyst, causing her to double over for two to three hours before subsiding. The following morning, symptoms consistent with a UTI were noticed.  She reports back pain and general discomfort, impacting her travel plans to [place] . No fever or chills are present, but she notes her usual body temperature is around 67F, considering 93F high for her. No vaginal pain, bleeding, or discharge is reported.  She has not taken any medication for her symptoms yet but considered using Azo, which she has not used in about fifteen years but recalls it being helpful. She has not experienced significant difficulty urinating, though she feels she might be starting to strain slightly.  She reports a previous adverse reaction to Augmentin , which caused significant stomach discomfort, but she denies known allergies.   Past Medical History:  Diagnosis Date   Anxiety    Calculus of gallbladder    Concussion    GERD (gastroesophageal reflux disease)    Headache     Kidney stone     Patient Active Problem List   Diagnosis Date Noted   Strain of calf muscle 06/26/2024   Post-viral cough syndrome 03/06/2024   Polyarthralgia 03/06/2024   SOB (shortness of breath) 01/03/2024   B12 deficiency 11/17/2023   Vitamin D  deficiency 11/17/2023   History of kidney stones 10/13/2023   Chronic neck pain 08/17/2023   Recent skin changes 08/17/2023   Solar lentigo 08/17/2023   Anxiety 08/17/2023   Palpitations 08/12/2022   Gallstones 06/15/2022   Epigastric pain determined by examination 07/29/2018   Nausea 07/29/2018   History of urinary tract infection 07/29/2018   Birth control 01/14/2016   History of anemia 01/14/2016   Family history of cardiac disorder 01/14/2016    Past Surgical History:  Procedure Laterality Date   DILATION AND CURETTAGE OF UTERUS  2015    OB History   No obstetric history on file.      Home Medications    Prior to Admission medications  Medication Sig Start Date End Date Taking? Authorizing Provider  nitrofurantoin , macrocrystal-monohydrate, (MACROBID ) 100 MG capsule Take 1 capsule (100 mg total) by mouth 2 (two) times daily for 5 days. 07/07/24 07/12/24 Yes Kaien Pezzullo E, PA-C  clonazePAM  (KLONOPIN ) 0.25 MG disintegrating tablet Take 1 tablet (0.25 mg total) by mouth 2 (two) times daily. 08/16/23   Breeback, Jade L, PA-C  diclofenac  Sodium (VOLTAREN ) 1 % GEL Apply 4 g topically 4 (four) times daily. To affected joint. 06/26/24   Breeback, Jade L, PA-C  fluticasone  (FLONASE ) 50 MCG/ACT nasal spray Place 2 sprays into  both nostrils daily. 10/14/23   Gladis Elsie BROCKS, PA-C  ibuprofen  (ADVIL ) 800 MG tablet Take 1 tablet (800 mg total) by mouth 3 (three) times daily. 04/24/24   Rising, Rebecca, PA-C  Vitamin D , Ergocalciferol , (DRISDOL ) 1.25 MG (50000 UNIT) CAPS capsule Take 1 capsule (50,000 Units total) by mouth every 7 (seven) days. 03/07/24   Antoniette Vermell CROME, PA-C    Family History Family History  Problem Relation Age of  Onset   Atrial fibrillation Mother    Hyperlipidemia Father    Hypertension Father    Heart attack Father    Lymphoma Maternal Grandmother    Dementia Maternal Grandfather    Diabetes Paternal Grandmother    Stroke Paternal Grandmother    Heart attack Paternal Grandmother    Cancer Paternal Grandfather        LIVER   Heart attack Paternal Grandfather    Colon cancer Maternal Uncle    Stomach cancer Paternal Uncle    Esophageal cancer Paternal Uncle    Pancreatic cancer Neg Hx    Rectal cancer Neg Hx     Social History Social History[1]   Allergies   Ciprofloxacin and Augmentin  [amoxicillin -pot clavulanate]   Review of Systems Review of Systems  Gastrointestinal:  Positive for abdominal pain (cramping).  Genitourinary:  Positive for decreased urine volume, difficulty urinating and frequency. Negative for hematuria, vaginal bleeding, vaginal discharge and vaginal pain.     Physical Exam Triage Vital Signs ED Triage Vitals  Encounter Vitals Group     BP 07/07/24 1801 118/78     Girls Systolic BP Percentile --      Girls Diastolic BP Percentile --      Boys Systolic BP Percentile --      Boys Diastolic BP Percentile --      Pulse Rate 07/07/24 1801 (!) 103     Resp 07/07/24 1801 16     Temp 07/07/24 1801 98.5 F (36.9 C)     Temp src --      SpO2 07/07/24 1801 97 %     Weight 07/07/24 1806 165 lb (74.8 kg)     Height 07/07/24 1806 5' 6 (1.676 m)     Head Circumference --      Peak Flow --      Pain Score 07/07/24 1805 3     Pain Loc --      Pain Education --      Exclude from Growth Chart --    No data found.  Updated Vital Signs BP 118/78   Pulse (!) 103   Temp 98.5 F (36.9 C)   Resp 16   Ht 5' 6 (1.676 m)   Wt 165 lb (74.8 kg)   LMP 06/15/2024 (Exact Date)   SpO2 97%   BMI 26.63 kg/m   Visual Acuity Right Eye Distance:   Left Eye Distance:   Bilateral Distance:    Right Eye Near:   Left Eye Near:    Bilateral Near:     Physical  Exam Vitals reviewed.  Constitutional:      General: She is awake.     Appearance: Normal appearance. She is well-developed and well-groomed.  HENT:     Head: Normocephalic and atraumatic.  Eyes:     General: Lids are normal. Gaze aligned appropriately.     Extraocular Movements: Extraocular movements intact.     Conjunctiva/sclera: Conjunctivae normal.  Pulmonary:     Effort: Pulmonary effort is normal.  Abdominal:  General: Abdomen is flat. Bowel sounds are normal.     Palpations: Abdomen is soft.     Tenderness: There is abdominal tenderness in the suprapubic area. There is no right CVA tenderness, left CVA tenderness or guarding. Negative signs include Murphy's sign, Rovsing's sign and McBurney's sign.  Neurological:     Mental Status: She is alert and oriented to person, place, and time.  Psychiatric:        Attention and Perception: Attention and perception normal.        Mood and Affect: Mood and affect normal.        Speech: Speech normal.        Behavior: Behavior normal. Behavior is cooperative.      UC Treatments / Results  Labs (all labs ordered are listed, but only abnormal results are displayed) Labs Reviewed  POCT URINALYSIS DIP (MANUAL ENTRY) - Abnormal; Notable for the following components:      Result Value   Ketones, POC UA small (15) (*)    Blood, UA trace-intact (*)    Leukocytes, UA Small (1+) (*)    All other components within normal limits  URINE CULTURE  POCT URINE PREGNANCY    EKG   Radiology No results found.  Procedures Procedures (including critical care time)  Medications Ordered in UC Medications - No data to display  Initial Impression / Assessment and Plan / UC Course  I have reviewed the triage vital signs and the nursing notes.  Pertinent labs & imaging results that were available during my care of the patient were reviewed by me and considered in my medical decision making (see chart for details).      Final Clinical  Impressions(s) / UC Diagnoses   Final diagnoses:  Dysuria  Urinary tract infection with hematuria, site unspecified   Acute urinary tract infection Symptoms of bladder discomfort, cramping, and lower back pain. Urinalysis shows white blood cells and blood, indicating a UTI. No fever, chills, or significant dysuria. Differential includes ovarian cyst, but symptoms align more with UTI. Augmentin  previously caused gastrointestinal upset, so Macrobid  is preferred due to its efficacy and tolerability in younger patients. - Sent urine sample for culture to identify causative bacteria and confirm antibiotic sensitivity. - Prescribed Macrobid  for UTI treatment. - Recommended Azo for symptomatic relief until antibiotic takes effect. - Advised Tylenol or ibuprofen  as needed for pain management. - Instructed to seek emergency care if fever, chills, significant dysuria, severe pain, or confusion develop.    Discharge Instructions      Based on your symptoms and results of the urinalysis I believe you have a UTI I recommend the following:  I have sent in a script for Macrobid  100 mg to be taken by mouth twice per day for 5 days Please finish the entire course of the antibiotic even if you are feeling better before it is completed unless you develop an allergic reaction or are told by a medical provider to stop taking it. We have sent a sample of your urine off for a urine culture.  This will help us  determine what bacteria is causing your symptoms as well as the most appropriate antibiotic to treat it.  If we need to make any adjustments to your medication regimen and new medication will be sent to the pharmacy on file and you will be updated via phone call in MyChart. Stay well hydrated (at least 75 oz of water per day) and avoid holding your urine for prolonged periods of time.  If you have any of the following please return to urgent care or go to the emergency room: Persistent symptoms, fever, trouble  urinating or inability to urinate, confusion, flank pain.       ED Prescriptions     Medication Sig Dispense Auth. Provider   nitrofurantoin , macrocrystal-monohydrate, (MACROBID ) 100 MG capsule Take 1 capsule (100 mg total) by mouth 2 (two) times daily for 5 days. 10 capsule Quindarrius Joplin E, PA-C      PDMP not reviewed this encounter.    [1]  Social History Tobacco Use   Smoking status: Never   Smokeless tobacco: Never  Vaping Use   Vaping status: Never Used  Substance Use Topics   Alcohol use: Not Currently   Drug use: Never     Tarica Harl, Rocky BRAVO, PA-C 07/07/24 1838

## 2024-07-07 NOTE — Telephone Encounter (Signed)
°  FYI Only or Action Required?: Action required by provider: request for appointment.  Patient was last seen in primary care on 06/26/2024 by Antoniette Vermell CROME, PA-C.  Called Nurse Triage reporting Urinary symptoms.  Symptoms began several days ago.  Interventions attempted: Nothing.  Symptoms are: unchanged.Cramping in my bladder, especially if I drink something sweet. Will go to UC for worsening of symptoms.  Triage Disposition: See Physician Within 24 Hours  Patient/caregiver understands and will follow disposition?:       Copied from CRM #[id number]. Topic: Clinical - Red Word Triage >> Jul 07, 2024 11:46 AM Wess RAMAN wrote: Red Word that prompted transfer to Nurse Triage: Patient believes she has a UTI  Symptoms: cramping in bladder, hurts when drinking soda or sweet tea, pain during urination.  Would like an appt for today or Monday. Answer Assessment - Initial Assessment Questions 1. SYMPTOM: What's the main symptom you're concerned about? (e.g., frequency, incontinence)     Cramps in bladder 2. ONSET: When did the    start?     2 nights ago 3. PAIN: Is there any pain? If Yes, ask: How bad is it? (Scale: 1-10; mild, moderate, severe)     cramping 4. CAUSE: What do you think is causing the symptoms?     uti 5. OTHER SYMPTOMS: Do you have any other symptoms? (e.g., blood in urine, fever, flank pain, pain with urination)     no 6. PREGNANCY: Is there any chance you are pregnant? When was your last menstrual period?     no  Protocols used: Urinary Symptoms-A-AH  Reason for Disposition  Urinating more frequently than usual (i.e., frequency) OR new-onset of the feeling of an urgent need to urinate (i.e., urgency)  Answer Assessment - Initial Assessment Questions 1. SYMPTOM: What's the main symptom you're concerned about? (e.g., frequency, incontinence)     Cramps in bladder 2. ONSET: When did the    start?     2 nights ago 3. PAIN: Is there any  pain? If Yes, ask: How bad is it? (Scale: 1-10; mild, moderate, severe)     cramping 4. CAUSE: What do you think is causing the symptoms?     uti 5. OTHER SYMPTOMS: Do you have any other symptoms? (e.g., blood in urine, fever, flank pain, pain with urination)     no 6. PREGNANCY: Is there any chance you are pregnant? When was your last menstrual period?     no  Protocols used: Urinary Symptoms-A-AH

## 2024-07-07 NOTE — Discharge Instructions (Signed)
 Based on your symptoms and results of the urinalysis I believe you have a UTI I recommend the following:  I have sent in a script for Macrobid 100 mg to be taken by mouth twice per day for 5 days Please finish the entire course of the antibiotic even if you are feeling better before it is completed unless you develop an allergic reaction or are told by a medical provider to stop taking it. We have sent a sample of your urine off for a urine culture.  This will help us  determine what bacteria is causing your symptoms as well as the most appropriate antibiotic to treat it.  If we need to make any adjustments to your medication regimen and new medication will be sent to the pharmacy on file and you will be updated via phone call in MyChart. Stay well hydrated (at least 75 oz of water per day) and avoid holding your urine for prolonged periods of time. If you have any of the following please return to urgent care or go to the emergency room: Persistent symptoms, fever, trouble urinating or inability to urinate, confusion, flank pain.

## 2024-07-07 NOTE — Telephone Encounter (Signed)
 The patient is scheduled with Joy on 07/10/24 to address the following symptoms.

## 2024-07-07 NOTE — ED Triage Notes (Signed)
 Last night had pelvic pain. Today has felt like she's had pelvic cramps similar to menstrual cramps. Reports bladder cramping when she has to urinate. Does not feel right when urinating. No hematuria. Has not noticed odor. Had frequent urination earlier today and feels like she can't urinate now. Has been drinking water and cranberry juice. No fever but reports she's normally 97.9 and doesn't go above 98 F oral. No otc meds.

## 2024-07-08 DIAGNOSIS — I499 Cardiac arrhythmia, unspecified: Secondary | ICD-10-CM | POA: Diagnosis not present

## 2024-07-08 DIAGNOSIS — Z881 Allergy status to other antibiotic agents status: Secondary | ICD-10-CM | POA: Diagnosis not present

## 2024-07-08 DIAGNOSIS — R0789 Other chest pain: Secondary | ICD-10-CM | POA: Diagnosis not present

## 2024-07-08 DIAGNOSIS — Z79899 Other long term (current) drug therapy: Secondary | ICD-10-CM | POA: Diagnosis not present

## 2024-07-08 DIAGNOSIS — F410 Panic disorder [episodic paroxysmal anxiety] without agoraphobia: Secondary | ICD-10-CM | POA: Diagnosis not present

## 2024-07-08 DIAGNOSIS — R61 Generalized hyperhidrosis: Secondary | ICD-10-CM | POA: Diagnosis not present

## 2024-07-08 DIAGNOSIS — R42 Dizziness and giddiness: Secondary | ICD-10-CM | POA: Diagnosis not present

## 2024-07-08 DIAGNOSIS — D649 Anemia, unspecified: Secondary | ICD-10-CM | POA: Diagnosis not present

## 2024-07-08 DIAGNOSIS — R079 Chest pain, unspecified: Secondary | ICD-10-CM | POA: Diagnosis not present

## 2024-07-10 ENCOUNTER — Encounter: Payer: Self-pay | Admitting: Medical-Surgical

## 2024-07-10 ENCOUNTER — Ambulatory Visit: Admitting: Medical-Surgical

## 2024-07-10 VITALS — BP 125/75 | HR 88 | Resp 20 | Ht 66.0 in | Wt 164.5 lb

## 2024-07-10 DIAGNOSIS — F419 Anxiety disorder, unspecified: Secondary | ICD-10-CM | POA: Diagnosis not present

## 2024-07-10 DIAGNOSIS — R300 Dysuria: Secondary | ICD-10-CM | POA: Diagnosis not present

## 2024-07-10 DIAGNOSIS — H538 Other visual disturbances: Secondary | ICD-10-CM | POA: Diagnosis not present

## 2024-07-10 DIAGNOSIS — M2550 Pain in unspecified joint: Secondary | ICD-10-CM | POA: Diagnosis not present

## 2024-07-10 DIAGNOSIS — E559 Vitamin D deficiency, unspecified: Secondary | ICD-10-CM | POA: Diagnosis not present

## 2024-07-10 DIAGNOSIS — E538 Deficiency of other specified B group vitamins: Secondary | ICD-10-CM | POA: Diagnosis not present

## 2024-07-10 LAB — POCT GLYCOSYLATED HEMOGLOBIN (HGB A1C): Hemoglobin A1C: 4.9 % (ref 4.0–5.6)

## 2024-07-10 NOTE — Progress Notes (Signed)
 Established patient visit   History of Present Illness   Discussed the use of AI scribe software for clinical note transcription with the patient, who gave verbal consent to proceed.  History of Present Illness   Cindy Dunlap is a 33 year old female who presents with severe abdominal pain and urinary symptoms.  Abdominal pain and urinary symptoms - Severe unilateral lower abdominal pain onset Wednesday night, unsure of which side - Dysuria and other urinary tract infection symptoms developed by Thursday and worsened - Evaluated at urgent care on Friday, diagnosed with urinary tract infection - Started on Azo and Macrobid  - Currently taking Macrobid   Acute systemic symptoms - On Saturday, developed acute left upper chest pain, blurred vision, and rapid heart rate while shopping - Evaluated in the emergency department, underwent testing, and was discharged  Musculoskeletal symptoms - Ongoing muscle spasms, cramping, and tightness for approximately one month - Concern for possible injury related to these symptoms  Constitutional and neurologic symptoms - Persistent fatigue, blurred vision, and loss of taste and smell since August following an illness in her family  Psychiatric and gastrointestinal symptoms - Anxiety and heartburn - Prescribed Klonopin  but has not taken it  Nutritional deficiencies - Low vitamin D  and B12 levels  - Was prescribed high dose vitamin D  replacement, only took 3 doses then stopped quoting I don't like taking medicine.  Blurred vision UTD on eye exams and unclear etiology. Has been drinking a lot of sugary sodas and wonders if her A1c is elevated.  - POCT A1c 4.9% today.      Physical Exam   Physical Exam Vitals reviewed.  Constitutional:      General: She is not in acute distress.    Appearance: Normal appearance.  HENT:     Head: Normocephalic and atraumatic.  Cardiovascular:     Rate and Rhythm: Normal rate and regular rhythm.      Pulses: Normal pulses.     Heart sounds: Normal heart sounds. No murmur heard.    No friction rub. No gallop.  Pulmonary:     Effort: Pulmonary effort is normal. No respiratory distress.     Breath sounds: Normal breath sounds. No wheezing.  Skin:    General: Skin is warm and dry.  Neurological:     Mental Status: She is alert and oriented to person, place, and time.  Psychiatric:        Mood and Affect: Mood normal.        Behavior: Behavior normal.        Thought Content: Thought content normal.        Judgment: Judgment normal.    Assessment & Plan   Problem List Items Addressed This Visit       Other   Anxiety   B12 deficiency   Polyarthralgia   Vitamin D  deficiency   Other Visit Diagnoses       Blurred vision, bilateral    -  Primary   Relevant Orders   POCT HgB A1C (Completed)     Dysuria          Assessment and Plan    Urinary tract infection Recent UTI treated with Macrobid  and Azo. Urinalysis consistent with UTI. Awaiting urine culture results. - Continue Macrobid  as prescribed. - Await urine culture results for further management.  Vitamin D  deficiency Low vitamin D  levels contributing to muscle spasms and cramping. Prescribed high-dose vitamin D . - Continue high-dose vitamin D  once a  week. - Consider multivitamin with vitamin D .  Vitamin B12 deficiency Low vitamin B12 levels potentially contributing to fatigue and cognitive symptoms. - Take a multivitamin containing vitamin B12.  Generalized anxiety disorder Experiencing anxiety symptoms. Although recent myriad of symptoms not her usual anxiety presentation, feel that anxiety is certainly a factor. Prescribed Klonopin  for management. Agreed to try Klonopin  today. - Take Klonopin  as prescribed for anxiety management.  Heartburn Heartburn possibly related to medication timing. Advised on Pepcid  use. - Take Pepcid  as needed, ensuring it is taken at least two hours after Macrobid .     Follow up    Return if symptoms worsen or fail to improve. __________________________________ Zada FREDRIK Palin, DNP, APRN, FNP-BC Primary Care and Sports Medicine [facility] [place]

## 2024-07-11 ENCOUNTER — Ambulatory Visit (HOSPITAL_COMMUNITY): Payer: Self-pay

## 2024-07-11 LAB — URINE CULTURE: Culture: <10000 — AB

## 2024-07-25 ENCOUNTER — Telehealth: Payer: Self-pay

## 2024-07-25 MED ORDER — ONDANSETRON 8 MG PO TBDP: 8.0000 mg | ORAL_TABLET | Freq: Three times a day (TID) | ORAL | 0 refills | Status: AC | PRN

## 2024-07-25 NOTE — Telephone Encounter (Signed)
 Zofran  sent to the pharmacy to help with nausea. Has had this in the past on several occasions and it was well tolerated.   ___________________________________________ Zada FREDRIK Palin, DNP, APRN, FNP-BC Primary Care and Sports Medicine [facility] [place]

## 2024-07-25 NOTE — Telephone Encounter (Signed)
 Copied from CRM #[id number]. Topic: Clinical - Medication Question >> Jul 25, 2024 11:31 AM Olam RAMAN wrote: Reason for CRM: pt would like a flu medication. Zofran  for nausea  Cb:  [phone]

## 2024-07-25 NOTE — Telephone Encounter (Signed)
 Spoke with the patient who states she is not looking to get flu medication just something for nausea she started having flu like symptoms on christmas day and that her family was exposed to the flu she hasn't taken any type of test, states she thinks the worse part is over but is having severe nausea and Pepto bismol not helping at all. Please advise

## 2024-07-25 NOTE — Addendum Note (Signed)
 Addended byBETHA WILLO MINI on: 07/25/2024 12:28 PM   Modules accepted: Orders

## 2024-07-28 NOTE — Telephone Encounter (Signed)
 Last read by Marty Idol at 3:44PM on 07/25/2024.

## 2024-08-14 ENCOUNTER — Ambulatory Visit: Payer: Self-pay

## 2024-08-14 NOTE — Telephone Encounter (Signed)
 FYI Only or Action Required?: FYI only for provider: appointment scheduled on 1/20.  Patient was last seen in primary care on 07/10/2024 by Willo Mini, NP.  Called Nurse Triage reporting Headache.  Symptoms began a week ago.  Interventions attempted: OTC medications: utilized numerous different OTC medications, states also used an old flexeril .  Symptoms are: unchanged.  Triage Disposition: See Physician Within 24 Hours  Patient/caregiver understands and will follow disposition?: Yes, will follow disposition  Reason for Triage: 1 week patient had a headache on the left side pain level pain 10. Started having back and neck pain started Saturday night.    Reason for Disposition  [1] MODERATE headache (e.g., interferes with normal activities) AND [2] present > 24 hours AND [3] unexplained  (Exceptions: Pain medicines not tried, typical migraine, or headache part of viral illness.)  Answer Assessment - Initial Assessment Questions 1. LOCATION: Where does it hurt?      Back left of head 2. ONSET: When did the headache start? (e.g., minutes, hours, days)      About 8 days 3. PATTERN: Does the pain come and go, or has it been constant since it started?     Dull and constant 4. SEVERITY: How bad is the pain? and What does it keep you from doing?  (e.g., Scale 1-10; mild, moderate, or severe)     10 5. RECURRENT SYMPTOM: Have you ever had headaches before? If Yes, ask: When was the last time? and What happened that time?      denies 6. CAUSE: What do you think is causing the headache?     unsure 7. MIGRAINE: Have you been diagnosed with migraine headaches? If Yes, ask: Is this headache similar?      denies 8. HEAD INJURY: Has there been any recent injury to your head?      denies 9. OTHER SYMPTOMS: Do you have any other symptoms? (e.g., fever, stiff neck, eye pain, sore throat, cold symptoms)     Neck tightness, L ear drainage-clear, nasal congestion 10.  PREGNANCY: Is there any chance you are pregnant? When was your last menstrual period?       Denies  Denies vision changes.  Protocols used: [facility]

## 2024-08-15 ENCOUNTER — Ambulatory Visit: Admitting: Physician Assistant

## 2024-08-15 VITALS — BP 123/74 | HR 83 | Ht 66.0 in | Wt 162.8 lb

## 2024-08-15 DIAGNOSIS — H60392 Other infective otitis externa, left ear: Secondary | ICD-10-CM

## 2024-08-15 DIAGNOSIS — J0140 Acute pansinusitis, unspecified: Secondary | ICD-10-CM | POA: Diagnosis not present

## 2024-08-15 DIAGNOSIS — R519 Headache, unspecified: Secondary | ICD-10-CM | POA: Insufficient documentation

## 2024-08-15 DIAGNOSIS — M542 Cervicalgia: Secondary | ICD-10-CM

## 2024-08-15 MED ORDER — CYCLOBENZAPRINE HCL 10 MG PO TABS: 10.0000 mg | ORAL_TABLET | Freq: Three times a day (TID) | ORAL | 0 refills | Status: AC | PRN

## 2024-08-15 MED ORDER — OFLOXACIN 0.3 % OT SOLN: 10.0000 [drp] | Freq: Every day | OTIC | 0 refills | Status: AC

## 2024-08-15 NOTE — Patient Instructions (Addendum)
 Start flonase  2 sprays each nostril twice a day Ibuprofen  as needed for HA and left upper neck pain Consider massage and icy hot patches Flexeril , muscle relaxer, as needed but may make sleepy

## 2024-08-16 ENCOUNTER — Encounter: Payer: Self-pay | Admitting: Physician Assistant

## 2024-08-16 NOTE — Progress Notes (Signed)
 "  Acute Office Visit  Subjective:     Patient ID: Cindy Dunlap, female    DOB: May 06, 1991, 34 y.o.   MRN: [id number]   HPI Discussed the use of AI scribe software for clinical note transcription with the patient, who gave verbal consent to proceed.  History of Present Illness Cindy Dunlap is a 34 year old female who presents with new persistent headaches and ear pain.  Headache - Persistent headache onset August 06, 2024 - Dull pain located at the back of the head, primarily on the left side - Radiates upwards - Pain severity confined her to bed over the weekend but seems to be improving now.  - Current pain level 2-3 out of 10 - Movement of neck exacerbates pain, sometimes radiates into shoulder - No vision changes - No history of trauma or injury - Tylenol and ibuprofen  taken every two hours without significant relief  Otalgia and otorrhea - Ear pain associated with headache - Clear drainage from ear - No purulent discharge - Full sensation on the affected side of the head  Sinus and neurological symptoms - Dizziness and wooziness since flu illness around christmas - Sinus pressure and congestion    ROS See HPI.      Objective:    BP 123/74   Pulse 83   Ht 5' 6 (1.676 m)   Wt 162 lb 12 oz (73.8 kg)   SpO2 100%   BMI 26.27 kg/m  BP Readings from Last 3 Encounters:  08/15/24 123/74  07/10/24 125/75  07/07/24 118/78   Wt Readings from Last 3 Encounters:  08/15/24 162 lb 12 oz (73.8 kg)  07/10/24 164 lb 8 oz (74.6 kg)  07/07/24 165 lb (74.8 kg)      Physical Exam Constitutional:      Appearance: Normal appearance.  HENT:     Head: Normocephalic.     Comments: No rash over area of headache.  Sinus pressure over ethmoid and frontal sinuses.     Right Ear: Tympanic membrane, ear canal and external ear normal. There is no impacted cerumen.     Left Ear: Tympanic membrane normal. There is no impacted cerumen.     Ears:     Comments: Tenderness to  palpation over tragus of left ear. External ear canal erythematous without swelling or discharge.     Nose: No congestion or rhinorrhea.  Cardiovascular:     Rate and Rhythm: Normal rate.  Pulmonary:     Effort: Pulmonary effort is normal.     Breath sounds: Normal breath sounds.  Musculoskeletal:     Cervical back: Normal range of motion and neck supple. No rigidity or tenderness.  Lymphadenopathy:     Cervical: No cervical adenopathy.  Neurological:     General: No focal deficit present.     Mental Status: She is alert and oriented to person, place, and time.     Cranial Nerves: No cranial nerve deficit.     Motor: No weakness.     Gait: Gait normal.  Psychiatric:        Mood and Affect: Mood normal.          Assessment & Plan:  .Cindy Dunlap was seen today for medical management of chronic issues.  Diagnoses and all orders for this visit:  Left-sided headache -     cyclobenzaprine  (FLEXERIL ) 10 MG tablet; Take 1 tablet (10 mg total) by mouth 3 (three) times daily as needed for muscle spasms.  Neck pain on left side -  cyclobenzaprine  (FLEXERIL ) 10 MG tablet; Take 1 tablet (10 mg total) by mouth 3 (three) times daily as needed for muscle spasms.  Other infective acute otitis externa of left ear -     ofloxacin  (FLOXIN ) 0.3 % OTIC solution; Place 10 drops into the left ear daily. For 7 days.  Acute non-recurrent pansinusitis    Assessment & Plan Headache Headache likely due to sinus congestion with dull pain and sinus pressure. No infection signs or red flags. Symptoms improving, no antibiotics needed at this time. No signs of rash to consider shingles.  - Prescribed Flonase  nasal spray for sinus congestion. - Ok for ibuprofen  as needed.  - Consider flexeril  to help with left sided upper back tension.  - Prescribed ear drops for external ear redness and pain.  - Advised use of icy hot patches on neck and upper back with heating pad.  - Instructed to report worsening  symptoms or new vision, speech, or weakness issues.  Neck pain Pain radiates to shoulder, worsens with movement, rated 2-3/10. No recent trauma. Could be causing some of the headache.  - Advised use of hot packs on neck and upper back. - Flexeril  as needed, sedation warning given.  - Exercises for neck given today.  - Recommended massage therapy for neck pain relief.    Santosh Petter, PA-C   "
